# Patient Record
Sex: Female | Born: 1965 | Race: White | Hispanic: No | Marital: Married | Smoking: Never smoker
Health system: Southern US, Community
[De-identification: ages and names within clinical notes are randomized; demographics above are authoritative.]

## PROBLEM LIST (undated history)

## (undated) DIAGNOSIS — Z8669 Personal history of other diseases of the nervous system and sense organs: Secondary | ICD-10-CM

## (undated) DIAGNOSIS — R42 Dizziness and giddiness: Secondary | ICD-10-CM

## (undated) DIAGNOSIS — K649 Unspecified hemorrhoids: Secondary | ICD-10-CM

## (undated) DIAGNOSIS — Z9889 Other specified postprocedural states: Secondary | ICD-10-CM

## (undated) DIAGNOSIS — R32 Unspecified urinary incontinence: Secondary | ICD-10-CM

## (undated) DIAGNOSIS — B009 Herpesviral infection, unspecified: Secondary | ICD-10-CM

## (undated) DIAGNOSIS — Z87440 Personal history of urinary (tract) infections: Secondary | ICD-10-CM

## (undated) DIAGNOSIS — M7500 Adhesive capsulitis of unspecified shoulder: Secondary | ICD-10-CM

## (undated) DIAGNOSIS — Z8759 Personal history of other complications of pregnancy, childbirth and the puerperium: Secondary | ICD-10-CM

## (undated) DIAGNOSIS — D0330 Melanoma in situ of unspecified part of face: Secondary | ICD-10-CM

## (undated) DIAGNOSIS — R253 Fasciculation: Secondary | ICD-10-CM

## (undated) DIAGNOSIS — R112 Nausea with vomiting, unspecified: Secondary | ICD-10-CM

## (undated) DIAGNOSIS — M1990 Unspecified osteoarthritis, unspecified site: Secondary | ICD-10-CM

## (undated) DIAGNOSIS — C801 Malignant (primary) neoplasm, unspecified: Secondary | ICD-10-CM

## (undated) DIAGNOSIS — I471 Supraventricular tachycardia: Secondary | ICD-10-CM

## (undated) HISTORY — DX: Personal history of other complications of pregnancy, childbirth and the puerperium: Z87.59

## (undated) HISTORY — DX: Other specified postprocedural states: Z98.890

## (undated) HISTORY — DX: Unspecified urinary incontinence: R32

## (undated) HISTORY — DX: Fasciculation: R25.3

## (undated) HISTORY — DX: Supraventricular tachycardia: I47.1

## (undated) HISTORY — DX: Unspecified hemorrhoids: K64.9

## (undated) HISTORY — DX: Dizziness and giddiness: R42

## (undated) HISTORY — DX: Personal history of urinary (tract) infections: Z87.440

## (undated) HISTORY — DX: Personal history of other diseases of the nervous system and sense organs: Z86.69

## (undated) HISTORY — DX: Unspecified osteoarthritis, unspecified site: M19.90

## (undated) HISTORY — DX: Adhesive capsulitis of unspecified shoulder: M75.00

## (undated) HISTORY — DX: Malignant (primary) neoplasm, unspecified: C80.1

## (undated) HISTORY — DX: Herpesviral infection, unspecified: B00.9

## (undated) HISTORY — DX: Melanoma in situ of unspecified part of face: D03.30

## (undated) HISTORY — DX: Other specified postprocedural states: R11.2

## (undated) HISTORY — PX: BASAL CELL CARCINOMA EXCISION: SHX1214

---

## 2000-06-30 HISTORY — PX: OTHER SURGICAL HISTORY: SHX169

## 2002-03-23 ENCOUNTER — Other Ambulatory Visit: Admission: RE | Admit: 2002-03-23 | Discharge: 2002-03-23 | Payer: Self-pay | Admitting: Obstetrics and Gynecology

## 2003-04-18 ENCOUNTER — Other Ambulatory Visit: Admission: RE | Admit: 2003-04-18 | Discharge: 2003-04-18 | Payer: Self-pay | Admitting: Obstetrics and Gynecology

## 2004-04-19 ENCOUNTER — Other Ambulatory Visit: Admission: RE | Admit: 2004-04-19 | Discharge: 2004-04-19 | Payer: Self-pay | Admitting: Obstetrics and Gynecology

## 2004-07-30 ENCOUNTER — Ambulatory Visit: Payer: Self-pay | Admitting: Family Medicine

## 2004-08-28 ENCOUNTER — Ambulatory Visit: Payer: Self-pay | Admitting: Family Medicine

## 2005-03-01 ENCOUNTER — Inpatient Hospital Stay (HOSPITAL_COMMUNITY): Admission: AD | Admit: 2005-03-01 | Discharge: 2005-03-05 | Payer: Self-pay | Admitting: Obstetrics and Gynecology

## 2005-04-17 ENCOUNTER — Other Ambulatory Visit: Admission: RE | Admit: 2005-04-17 | Discharge: 2005-04-17 | Payer: Self-pay | Admitting: Obstetrics and Gynecology

## 2005-07-23 ENCOUNTER — Ambulatory Visit: Payer: Self-pay | Admitting: Family Medicine

## 2006-04-15 ENCOUNTER — Ambulatory Visit: Payer: Self-pay | Admitting: Family Medicine

## 2011-07-16 ENCOUNTER — Encounter: Payer: Self-pay | Admitting: Family Medicine

## 2011-07-16 ENCOUNTER — Ambulatory Visit (INDEPENDENT_AMBULATORY_CARE_PROVIDER_SITE_OTHER): Payer: BC Managed Care – PPO | Admitting: Family Medicine

## 2011-07-16 VITALS — BP 118/74 | HR 64 | Temp 98.7°F | Ht 66.5 in | Wt 158.5 lb

## 2011-07-16 DIAGNOSIS — C4330 Malignant melanoma of unspecified part of face: Secondary | ICD-10-CM

## 2011-07-16 DIAGNOSIS — Z23 Encounter for immunization: Secondary | ICD-10-CM

## 2011-07-16 DIAGNOSIS — Z8669 Personal history of other diseases of the nervous system and sense organs: Secondary | ICD-10-CM | POA: Insufficient documentation

## 2011-07-16 DIAGNOSIS — B009 Herpesviral infection, unspecified: Secondary | ICD-10-CM | POA: Insufficient documentation

## 2011-07-16 DIAGNOSIS — R42 Dizziness and giddiness: Secondary | ICD-10-CM

## 2011-07-16 DIAGNOSIS — Z87440 Personal history of urinary (tract) infections: Secondary | ICD-10-CM | POA: Insufficient documentation

## 2011-07-16 DIAGNOSIS — D0330 Melanoma in situ of unspecified part of face: Secondary | ICD-10-CM | POA: Insufficient documentation

## 2011-07-16 DIAGNOSIS — R002 Palpitations: Secondary | ICD-10-CM

## 2011-07-16 MED ORDER — MOMETASONE FUROATE 50 MCG/ACT NA SUSP: 2.0000 | Freq: Every day | NASAL | Status: DC

## 2011-07-16 NOTE — Assessment & Plan Note (Addendum)
Discussed different etiologies of vertigo. Exam not consistent with BPPV. Given occurred with ST, resp sxs, anticipate labrynthitis.   Reasurred, if worsening to let me know for further eval (or meclizine vs consider referral to vestibular rehab.) Given some middle ear congestion on exam, provided with nasonex trial to speed recovery (if attributable to sinus congestion)

## 2011-07-16 NOTE — Assessment & Plan Note (Signed)
Pt prefers to treat episodes as they come. Discussed daily therapy (with 1gm vs 500mg ) or taking valtrex 1 wk around cycle to see if improvement in outbreaks.

## 2011-07-16 NOTE — Progress Notes (Signed)
Subjective:    Patient ID: Brandi Cherry, female    DOB: 01-18-1966, 46 y.o.   MRN: [id number]  HPI CC: new pt, re establish  Prior saw Dr. Milinda Antis.  Has not been seen by our office for several years.  Has been f/u with GYN, has seen cards in interim.  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  Takes valtrex 1000mg  as needed.  Typical of herpes - tingling/paresthesias prior.  Used to get every few years.  This last year worsening s/p several courses of valtrex.  Last episode lasted 4 mo (april to august), now seems to have flares with cycle.  Recent stress this month although pt doesn't necessarily feel stressed.  Also seems to get painful pustules on scalp with periods.  Palpitations - first episodes occurred with son's pregnancy, told pregnancy induced, heart monitor for 1 mo didn't notice any abnormality.  Delivery 6 yrs ago.  After delivery, had another episode.  Started taking lysine and zinc - worsened palpitations.  Returned to see cardiologist and nothing found on heart monitor.  May have had anxiety associated with this.  Last cardiology eval was 10/2010 [facility]).  Has had normal echo, slight murmur found.  H/o vertigo earlier in year.  Happening at work, epidoses of dizziness, nausea where she has to step out of meetings.  Worst episode was end of December (nausea/vomiting, vertigo), happened after bad ST, ear ache.  sxs reproducible with laying down flat.  Not positional or with head movements.  Vertigo described as "room spinning around me" and had fall due to this x1.  Never had abx.  Stays with "floating" feeling.  No allergic rhinitis sxs.  Mild PNDrainage sxs.  H/o miscarriage.  More irritable, wonders if approaching menopause.  OBGYN - Dr. Rana Snare.  Has had blood work done in summer and told all normal.  (thyroid checked)  Caffeine: occasional tea, rare caffeine otherwise Lives with husband, son and daughter, no pets Occupation: Clinical biochemist Edu: [employer] Activity: no  regular exercise Diet: good.  Lots of water, daily fruits/vegetables, red meat 1x/wk, fish 2x/wk  Preventative:  Well woman with OBGYN.  mammo normal. Last blood work was in summer. Flu shot - declines. Tetanus - unsure.  Would like today.  Tdap provided.  Medications and allergies reviewed and updated in chart.  Past histories reviewed and updated if relevant as below. There is no problem list on file for this patient.  Past Medical History  Diagnosis Date  . Palpitation     s/p multiple holters and normal  . Heart murmur   . Hx of migraines     with cycle  . Urine incontinence   . Vertigo   . 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 History of UTI   . Melanoma in situ of face     gets yearly skin checks [name])   Past Surgical History  Procedure Date  . Hospitalization 2002    urosepsis, again with son   History  Substance Use Topics  . Smoking status: Never Smoker   . Smokeless tobacco: Never Used  . Alcohol Use: No   Family History  Problem Relation Age of Onset  . Thyroid disease Mother   . Coronary artery disease Father 76    MI  . Hypertension Father   . Cancer Maternal Uncle     unsure  . Mental retardation Maternal Grandmother  bipolar  . Diabetes Neg Hx    No Known Allergies No current outpatient prescriptions on file prior to visit.   Review of Systems  Constitutional: Negative for fever, chills, activity change, appetite change, fatigue and unexpected weight change.  HENT: Negative for hearing loss and neck pain.   Eyes: Positive for visual disturbance.  Respiratory: Negative for cough, chest tightness, shortness of breath and wheezing.   Cardiovascular: Negative for chest pain, palpitations and leg swelling.  Gastrointestinal: Negative for nausea, vomiting, abdominal pain, diarrhea, constipation, blood in stool and abdominal distention.  Genitourinary: Negative for hematuria and  difficulty urinating.  Musculoskeletal: Negative for myalgias and arthralgias.  Skin: Negative for rash.  Neurological: Positive for dizziness. Negative for seizures, syncope and headaches.  Hematological: Does not bruise/bleed easily.  Psychiatric/Behavioral: Negative for dysphoric mood. The patient is not nervous/anxious.        Objective:   Physical Exam  Nursing note and vitals reviewed. Constitutional: She is oriented to person, place, and time. She appears well-developed and well-nourished. No distress.  HENT:  Head: Normocephalic and atraumatic.  Right Ear: Hearing, tympanic membrane, external ear and ear canal normal.  Left Ear: Hearing, tympanic membrane, external ear and ear canal normal.  Nose: Nose normal. No mucosal edema or rhinorrhea. Right sinus exhibits no maxillary sinus tenderness and no frontal sinus tenderness. Left sinus exhibits no maxillary sinus tenderness and no frontal sinus tenderness.  Mouth/Throat: Uvula is midline, oropharynx is clear and moist and mucous membranes are normal. No oropharyngeal exudate, posterior oropharyngeal edema, posterior oropharyngeal erythema or tonsillar abscesses.       Slight congestion behind TMs L>R No significant PNDrainage  Eyes: Conjunctivae and EOM are normal. Pupils are equal, round, and reactive to light. No scleral icterus.  Neck: Normal range of motion. Neck supple.  Cardiovascular: Normal rate, regular rhythm, normal heart sounds and intact distal pulses.   No murmur heard. Pulses:      Radial pulses are 2+ on the right side, and 2+ on the left side.  Pulmonary/Chest: Effort normal and breath sounds normal. No respiratory distress. She has no wheezes. She has no rales.  Abdominal: Soft. Bowel sounds are normal. She exhibits no distension and no mass. There is no tenderness. There is no rebound and no guarding.  Musculoskeletal: Normal range of motion.  Lymphadenopathy:    She has no cervical adenopathy.  Neurological:  She is alert and oriented to person, place, and time. She has normal strength. No cranial nerve deficit or sensory deficit. She exhibits normal muscle tone. Coordination and gait normal.       CN grossly intact, station and gait intact Normal FTN. Neg dix hallpike.  No nystagmus  Skin: Skin is warm and dry. No rash noted.       Hardened nodule on right forehead, no fluctuance.  Psychiatric: She has a normal mood and affect. Her behavior is normal. Judgment and thought content normal.       Assessment & Plan:

## 2011-07-16 NOTE — Patient Instructions (Signed)
For palpitations - we will monitor for now.  If coming back, let us know. For vertigo - doesn't sound like BPV.  Could be labrynthitis - see below.  If that is the case, should improve with time.  If return of vertigo, please let me know for referral to vestibular rehab. Work on starting multivitamin, incorporating exercise into routine. Good to meet you today, call us with questions. Return as needed or if any worsening of symptoms.  Labyrinthitis (Inner Ear Inflammation) Your exam shows you have an inner ear disturbance or labyrinthitis. The cause of this condition is not known. But it may be due to a virus infection. The symptoms of labyrinthitis include vertigo or dizziness made worse by motion, nausea and vomiting. The onset of labyrinthitis may be very sudden. It usually lasts for a few days and then clears up over 1-2 weeks. The treatment of an inner ear disturbance includes bed rest and medications to reduce dizziness, nausea, and vomiting. You should stay away from alcohol, tranquilizers, caffeine, nicotine, or any medicine your doctor thinks may make your symptoms worse. Further testing may be needed to evaluate your hearing and balance system. Please see your doctor or go to the emergency room right away if you have:  Increasing vertigo, earache, loss of hearing, or ear drainage.   Headache, blurred vision, trouble walking, fainting, or fever.   Persistent vomiting, dehydration, or extreme weakness.  Document Released: 06/16/2005 Document Revised: 02/26/2011 Document Reviewed: 12/02/2006 [facility] Patient Information 2012 [place], [place].

## 2011-07-16 NOTE — Assessment & Plan Note (Signed)
As this doesn't currently seem to be active, advised to monitor sxs for now. Discussed if sxs return to come in and would likely repeat holter.

## 2011-08-25 ENCOUNTER — Ambulatory Visit (INDEPENDENT_AMBULATORY_CARE_PROVIDER_SITE_OTHER): Payer: BC Managed Care – PPO | Admitting: Family Medicine

## 2011-08-25 ENCOUNTER — Encounter: Payer: Self-pay | Admitting: Family Medicine

## 2011-08-25 DIAGNOSIS — J069 Acute upper respiratory infection, unspecified: Secondary | ICD-10-CM

## 2011-08-25 NOTE — Patient Instructions (Signed)
Drink plenty of fluids, take ibuprofen with food as needed, and gargle with warm salt water for your throat.  Try to wean off the afrin.  Get some rest.  This should gradually improve.  Take care.  Let us know if you have other concerns.

## 2011-08-25 NOTE — Progress Notes (Signed)
duration of symptoms: 5 days, worse over the last 3 days.  Rhinorrhea: no Congestion: yes ear pain: yes sore throat: yes Cough: yes, dry Myalgias: yes other concerns: mult sick contacts at school Fever: over the weekend, ~101.  Face feels flushed.  Chills.  Out of work today.   No flu shot this past fall.   Having to use afrin at night. No other help with OTC meds.   ROS: See HPI.  Otherwise negative.    Meds, vitals, and allergies reviewed.   GEN: nad, alert and oriented HEENT: mucous membranes moist, TM w/o erythema, nasal epithelium injected, OP with cobblestoning, sinuses not ttp x4 NECK: supple w/o LA CV: rrr. PULM: ctab, no inc wob ABD: soft, +bs EXT: no edema

## 2011-08-26 DIAGNOSIS — J069 Acute upper respiratory infection, unspecified: Secondary | ICD-10-CM | POA: Insufficient documentation

## 2011-08-26 NOTE — Assessment & Plan Note (Signed)
Likely viral, nontoxic.  ddx d/w pt.  This could be flu, out of window for tamiflu and flu test likely not useful.  Not dehydrated.  Supportive tx and f/u prn.  She agrees. Okay for outpatient f/u.

## 2011-10-17 ENCOUNTER — Encounter: Payer: Self-pay | Admitting: Family Medicine

## 2011-10-17 ENCOUNTER — Ambulatory Visit (INDEPENDENT_AMBULATORY_CARE_PROVIDER_SITE_OTHER): Payer: BC Managed Care – PPO | Admitting: Family Medicine

## 2011-10-17 ENCOUNTER — Telehealth: Payer: Self-pay | Admitting: *Deleted

## 2011-10-17 VITALS — BP 126/76 | HR 88 | Temp 98.1°F | Wt 157.8 lb

## 2011-10-17 DIAGNOSIS — S0992XA Unspecified injury of nose, initial encounter: Secondary | ICD-10-CM | POA: Insufficient documentation

## 2011-10-17 DIAGNOSIS — S0993XA Unspecified injury of face, initial encounter: Secondary | ICD-10-CM

## 2011-10-17 DIAGNOSIS — S199XXA Unspecified injury of neck, initial encounter: Secondary | ICD-10-CM

## 2011-10-17 NOTE — Assessment & Plan Note (Signed)
Nasal injury last night. Midline swelling and pain. Possibly tip of nasal bone fracture but no displacement.   No other red flags. Supportive care discussed, rec continued NSAIDs, ice. If continued right nasal congestion even after inflammation resolved, to update me for referral to ENT. No need for facial CT today. Pt declines narcotics today.  Will continue ibuprofen.

## 2011-10-17 NOTE — Telephone Encounter (Signed)
Patient called and thinks she broke her nose after running into a door. She has been scheduled for this afternoon for eval. She says she feels fine and is at work. I checked with Terri and we do not do facial/skull xrays here. Do you want me to send her to [facility] or is it ok to have her come in here?

## 2011-10-17 NOTE — Progress Notes (Signed)
  Subjective:    Patient ID: Brandi Cherry, female    DOB: 27-Sep-1965, 46 y.o.   MRN: [id number]  HPI CC: nasal injury  DOI:10/16/2011 last night 10:45pm  Hit wooden part of door last night - direct blow to head.  Was more sleepy.  No EtOH.  No nose bleeding.  No clear drainage from nose.  Feels right side more closed up now.  Very painful currently, throbbing.  Swollen.  Has been taking ibuprofen to control pain.  Used ice last night.  Did go to work today.  Did have injury at age 65yo, hit nose into wall.  No surgery done.  Exact same spot.  No nasal or septal surgeries in past.  + HA.  No double vision.  No tooth pain.  No neck pain.  Review of Systems Per HPI    Objective:   Physical Exam  Nursing note and vitals reviewed. Constitutional: She appears well-developed and well-nourished. No distress.  HENT:  Head: Normocephalic.    Right Ear: Hearing, tympanic membrane, external ear and ear canal normal.  Left Ear: Hearing, tympanic membrane, external ear and ear canal normal.  Nose: Nasal deformity present. No septal deviation or nasal septal hematoma. Right sinus exhibits no maxillary sinus tenderness and no frontal sinus tenderness. Left sinus exhibits no maxillary sinus tenderness and no frontal sinus tenderness.  Mouth/Throat: Oropharynx is clear and moist. No oropharyngeal exudate.       Swelling bridge of nose with ecchymosis present as well as right sided swelling. Normal nasal turbinates visualized. No septal hematoma visualized. No nasal bleeding or clear discharge visualized. Maximal tenderness at midline nasal bridge.  No maxillary pain.  No TMJ pain. TMs clear  Neck: Normal range of motion. Neck supple.  Skin: Skin is warm and dry. No rash noted.  Psychiatric: She has a normal mood and affect.       Assessment & Plan:

## 2011-10-17 NOTE — Patient Instructions (Signed)
Could be broken nose.  Give more time for now. Continue ibuprofen and ice to nose (no more than 15 min at a time) If still trouble breathing out of right side after swelling subsides or any worsening, please let me know.  Nasal Fracture A nasal fracture is a break or crack in the bones of the nose. A minor break usually heals in a month. You often will receive black eyes from a nasal fracture. This is not a cause for concern. The black eyes will go away over 1 to 2 weeks.  DIAGNOSIS  Your caregiver may want to examine you if you are concerned about a fracture of the nose. X-rays of the nose may not show a nasal fracture even when one is present. Sometimes your caregiver must wait 1 to 5 days after the injury to re-check the nose for alignment and to take additional X-rays. Sometimes the caregiver must wait until the swelling has gone down. TREATMENT Minor fractures that have caused no deformity often do not require treatment. More serious fractures where bones are displaced may require surgery. This will take place after the swelling is gone. Surgery will stabilize and align the fracture. HOME CARE INSTRUCTIONS   Put ice on the injured area.   Put ice in a plastic bag.   Place a towel between your skin and the bag.   Leave the ice on for 15 to 20 minutes, 3 to 4 times a day.   Take medications as directed by your caregiver.   Only take over-the-counter or prescription medicines for pain, discomfort, or fever as directed by your caregiver.   If your nose starts bleeding, squeeze the soft parts of the nose against the center wall while you are sitting in an upright position for 10 minutes.   Contact sports should be avoided for at least 3 to 4 weeks or as directed by your caregiver.  SEEK MEDICAL CARE IF:  Your pain increases or becomes severe.   You continue to have nosebleeds.   The shape of your nose does not return to normal within 5 days.   You have pus draining from the nose.    SEEK IMMEDIATE MEDICAL CARE IF:   You have bleeding from your nose that does not stop after 20 minutes of pinching the nostrils closed and keeping ice on the nose.   You have clear fluid draining from your nose.   You notice a grape-like swelling on the dividing wall between the nostrils (septum). This is a collection of blood (hematoma) that must be drained to help prevent infection.   You have difficulty moving your eyes.   You have recurrent vomiting.  Document Released: 06/13/2000 Document Revised: 06/05/2011 Document Reviewed: 09/30/2010 [facility] Patient Information 2012 [place], [place].

## 2011-10-17 NOTE — Telephone Encounter (Signed)
No i can see her.  Thanks.

## 2011-11-10 ENCOUNTER — Telehealth: Payer: Self-pay | Admitting: Family Medicine

## 2011-11-10 NOTE — Telephone Encounter (Signed)
Caller: Toiya/Mother; PCP: Eustaquio Boyden; CB#: [phone]; ; ; Call regarding Back Pain;  Pt is calling about having back pain going down her right leg to her toes. Onset 1 week ago. It worsened on Friday 11/07/11. Rn triaged per back sx and scheduled pt tomorrow at 11:15 with Dr. Buena Irish.

## 2011-11-10 NOTE — Telephone Encounter (Signed)
Noted. Thanks.

## 2011-11-11 ENCOUNTER — Ambulatory Visit (INDEPENDENT_AMBULATORY_CARE_PROVIDER_SITE_OTHER): Payer: BC Managed Care – PPO | Admitting: Family Medicine

## 2011-11-11 ENCOUNTER — Encounter: Payer: Self-pay | Admitting: Family Medicine

## 2011-11-11 VITALS — BP 102/80 | HR 87 | Temp 98.1°F | Wt 157.0 lb

## 2011-11-11 DIAGNOSIS — M545 Low back pain, unspecified: Secondary | ICD-10-CM | POA: Insufficient documentation

## 2011-11-11 MED ORDER — NAPROXEN 500 MG PO TABS: ORAL_TABLET | ORAL | Status: AC

## 2011-11-11 MED ORDER — CYCLOBENZAPRINE HCL 10 MG PO TABS: 10.0000 mg | ORAL_TABLET | Freq: Two times a day (BID) | ORAL | Status: AC | PRN

## 2011-11-11 NOTE — Assessment & Plan Note (Signed)
Anticipate lumbar strain vs HNP. No red flags, no neurological deficits. Treat conservatively with NSAIDs, ice/heat, and muscle relaxant. Update [place] if not improving as expected.

## 2011-11-11 NOTE — Progress Notes (Signed)
  Subjective:    Patient ID: Brandi Cherry, female    DOB: [birth year], 46 y.o.   MRN: [id number]  HPI CC: leg pain  1+ wk h/o "pinched nerve feeling" in right lower back.  Progressively worsening over later part of last week.  Having shooting pain down leg.  Left posterior hip pain.  Now constant pain.  No inciting injury or trauma she noted.  Better when laying completely flat.  Watched movie over weekend laying down on couch.  At end of movie, had trouble getting up from back pain.    New thing at home - puppy in pen she has to lean over to lift him.  Has been using bengay, advil.  H/o lower back flares in past as well as sciatic irritation when pregnant.  No fevers/chills, numbness, weakness in leg, bowel/bladder incontinence.  Wt Readings from Last 3 Encounters:  11/11/11 157 lb (71.215 kg)  10/17/11 157 lb 12 oz (71.555 kg)  08/25/11 160 lb (72.576 kg)     Review of Systems Per HPI    Objective:   Physical Exam  Nursing note and vitals reviewed. Constitutional: She appears well-developed and well-nourished. No distress.       Uncomfortable with position changes  Musculoskeletal: She exhibits no edema.       + midline lumbar spine tenderness as well as paraspinal mm tenderness/spasm. Neg SLR bilaterally. No pain with int/ext rotation of hip. No pain with FABER at SIJ + pain at SIJ bilaterally R>L. No pain at sciatic notch or GTB.  Neurological: She is alert. She has normal strength and normal reflexes. No sensory deficit.  Reflex Scores:      Patellar reflexes are 2+ on the right side and 2+ on the left side.      Achilles reflexes are 2+ on the right side and 2+ on the left side.      Able to heel/toe walk.       Assessment & Plan:

## 2011-11-11 NOTE — Patient Instructions (Signed)
I think you have lumbar strain, possible herniated disk. Treat with prescription anti inflammatory as well as muscle relaxant as needed (use muscle relaxant when you're at home or at night as it can make you sleepy) Stretching exercises for lower back provided. Update [place] if not improving as expected or any worsening.

## 2012-06-30 DIAGNOSIS — M7500 Adhesive capsulitis of unspecified shoulder: Secondary | ICD-10-CM

## 2012-06-30 HISTORY — DX: Adhesive capsulitis of unspecified shoulder: M75.00

## 2012-07-21 DIAGNOSIS — I4719 Other supraventricular tachycardia: Secondary | ICD-10-CM | POA: Insufficient documentation

## 2012-07-21 DIAGNOSIS — I471 Supraventricular tachycardia: Secondary | ICD-10-CM | POA: Insufficient documentation

## 2013-01-25 ENCOUNTER — Ambulatory Visit (INDEPENDENT_AMBULATORY_CARE_PROVIDER_SITE_OTHER): Payer: BC Managed Care – PPO | Admitting: Family Medicine

## 2013-01-25 ENCOUNTER — Encounter: Payer: Self-pay | Admitting: Family Medicine

## 2013-01-25 VITALS — BP 110/68 | HR 100 | Temp 98.2°F | Wt 162.0 lb

## 2013-01-25 DIAGNOSIS — M79609 Pain in unspecified limb: Secondary | ICD-10-CM

## 2013-01-25 DIAGNOSIS — M79601 Pain in right arm: Secondary | ICD-10-CM | POA: Insufficient documentation

## 2013-01-25 MED ORDER — NAPROXEN 500 MG PO TABS: ORAL_TABLET | ORAL | Status: DC

## 2013-01-25 NOTE — Assessment & Plan Note (Signed)
Not consistent with one etiology - anticipate combination of biceps tendinitis and RTC injury. Treat as such with naprosyn course, ice, rest, and stretching exercises provided from [facility] pt advisor on RTC injury and biceps tendinopathy If not better after 2 weeks, or any worsening, recommended she call to schedule appointment with Dr. Patsy Lager for further evaluation. pt agrees with plan.

## 2013-01-25 NOTE — Patient Instructions (Signed)
I think you have combination of biceps tendinopathy and rotator cuff inflammation. Do stretching exercises provided along with naprosyn twice daily with food.  Continue ice of shoulder and rest. If not improving with this, call us to schedule follow up appointment with Dr. Patsy Lager. Good to see you today, call us with questions.

## 2013-01-25 NOTE — Progress Notes (Signed)
  Subjective:    Patient ID: Brandi Cherry, female    DOB: July 25, 1965, 47 y.o.   MRN: [id number]  HPI CC: R shoulder pain  Almost 1 mo h/o R shoulder pain.  Worse with laying on arm.  Pain with supination of R forearm, worse with raising arm overhead behind her.  + pinching superior R shoulder.  Occasional radiation to thumb.  Shooting pain upper arm.  So far has tried ice, ibuprofen 400mg  TID (didn't really help). Denies inciting trauma/injury.  No numbness or weakness of R arm. No neck pain.  13 hour drive to [place] recently.  Past Medical History  Diagnosis Date  . Palpitation     s/p multiple holters - finally found possible SVT - improved after started beta blocker (Dr. Tresa Endo)  . Heart murmur   . Hx of migraines     with cycle  . Urine incontinence   . Vertigo   . 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 History of UTI     tends to get with relations  . Melanoma in situ of face     gets yearly skin checks [name])  . History of miscarriage     G5P2     Review of Systems Per HPI    Objective:   Physical Exam  Nursing note and vitals reviewed. Constitutional: She is oriented to person, place, and time. She appears well-developed and well-nourished. No distress.  Musculoskeletal: She exhibits no edema.  FROM at neck. Limited ROM of R shoulder past 90 deg in abduction and forward flexion 2/2 pain. Tender to palpation at anterior shoulder, biceps tendon and lateral belly of triceps muscle. No tenderness with testing of SITS against resistance except mildly positive empty can sign on right. Mild pain with crossover and impingement test on right. No pain with rotation of humeral head in [facility] joint. + speed and yerguson sign.  Neurological: She is alert and oriented to person, place, and time. No sensory deficit.  Skin: Skin is warm and dry. No rash noted.       Assessment & Plan:

## 2013-01-31 ENCOUNTER — Telehealth: Payer: Self-pay

## 2013-01-31 NOTE — Telephone Encounter (Signed)
Pt left v/m; pt saw Dr Reece Agar on 01/25/13 and shoulder is no better; Dr Patsy Lager will be out of office until 02/16/13; pt request referral.Please advise.

## 2013-01-31 NOTE — Telephone Encounter (Signed)
plz offer referral to Dr. Katrinka Blazing our Sports Med doc in [place] if she doesn't mind driving there, or we will refer to local ortho

## 2013-01-31 NOTE — Telephone Encounter (Signed)
Message left notifying patient. Advised to call me back and let me know what she would like to do. Will await return call.

## 2013-02-03 ENCOUNTER — Encounter: Payer: Self-pay | Admitting: Family Medicine

## 2013-02-03 ENCOUNTER — Ambulatory Visit (INDEPENDENT_AMBULATORY_CARE_PROVIDER_SITE_OTHER): Payer: BC Managed Care – PPO | Admitting: Family Medicine

## 2013-02-03 VITALS — BP 120/78 | HR 82 | Ht 66.0 in | Wt 163.0 lb

## 2013-02-03 DIAGNOSIS — M7501 Adhesive capsulitis of right shoulder: Secondary | ICD-10-CM

## 2013-02-03 DIAGNOSIS — M7500 Adhesive capsulitis of unspecified shoulder: Secondary | ICD-10-CM | POA: Insufficient documentation

## 2013-02-03 MED ORDER — TRIAMCINOLONE ACETONIDE 40 MG/ML IJ SUSP: 40.0000 mg | Freq: Once | INTRAMUSCULAR | Status: DC

## 2013-02-03 NOTE — Assessment & Plan Note (Addendum)
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  adhesive capsulitis  After informed written and verbal consent, patient was seated on exam table. Right shoulder was prepped with alcohol swab and utilizing posterior approach, patient's right glenohumeral space was injected with 2:2:1  Lidocaine 1% :marcaine 0.5%: Kenalog 40mg /dL. Patient tolerated the procedure well without immediate complications.  Patient was given a systematic ROM protocol from [facility] to be done daily. Emphasized that without adherence to HEP, likely will not get better.  Tylenol or NSAID of choice prn for pain relief  Patient will be sent for formal PT for aggressive frozen shoulder ROM. Will return in 4-8 weeks for further evaluation and likely repeat corticosteroid injection.

## 2013-02-03 NOTE — Patient Instructions (Signed)
You have a frozen shoulder (adhesive capsulitis), a buildup of scar tissue that limits motion of the shoulder joint. We do not know what causes it and it can take quite some time to heal which can be frustrating. I am here to help.  Limit lifting and overhead activities as much as possible. Heat 20 minutes at a time 3-4 times a day may help with movement and stiffness. Tylenol and/or aleve as needed for pain and inflammation. Steroid injections in a series have been shown to help with pain and motion and I did this today.   Usually I do one at first appointment and will see you again in 4-8 weeks.  Codman exercises (pendulum, wall walking or table slides, arm circles) - do 3 sets of 15 daily of each of these. Physical therapy for rotator cuff strengthening is helpful. Follow up in 4-8 weeks

## 2013-02-03 NOTE — Progress Notes (Signed)
I'm seeing this patient by the request  of:  Dr. Sharen Cherry  CC and history of present illness: Patient is a very pleasant left-hand dominant 47 year old female is coming in with right shoulder pain. Patient states that approximately last month she's been having pain of the right shoulder. Patient somewhat remembers in acute onset of pain when she was in [place] and she went to pull herself up from a boat. Patient states that she had somewhat of a sharp pain at that time but was still able to do all her range of motion. Over the course of time it seems to have been worsening. Patient states that the pain is bad but in addition to that she is losing her range of motion. Patient states that it is very difficult to reach behind her back. Patient states that the pain is mostly on the right shoulder and she states posterior aspect hurts more. Patient describes as a dull ache with sometimes sharp searing pain that does not radiate. Patient denies any numbness or tingling but states that the arm feels heavy. Patient has tried some naproxen with minimal benefit. Patient states icing does help a little bit patient states that the pain is waking her up at night and affecting all activities of daily living including reaching above her head.   Past medical, surgical, family and social history reviewed. Medications reviewed all in the electronic medical record.   Review of Systems: No headache, visual changes, nausea, vomiting, diarrhea, constipation, dizziness, abdominal pain, skin rash, fevers, chills, night sweats, weight loss, swollen lymph nodes, body aches, joint swelling, muscle aches, chest pain, shortness of breath, mood changes.   Objective:    Blood pressure 120/78, pulse 82, height 5\' 6"  (1.676 m), weight 163 lb (73.936 kg), last menstrual period 01/06/2013, SpO2 98.00%.   General: No apparent distress alert and oriented x3 mood and affect normal HEENT: Pupils equal, extraocular movements  intact Respiratory: Patient's speak in full sentences and does not appear short of breath Cardiovascular: No lower extremity edema Skin: Warm dry intact with no signs of infection or rash Abdomen: Soft nontender Neuro: Cranial nerves II through XII are intact, neurovascularly intact in all extremities with 2+ DTRs and 2+ pulses. Shoulder: Right Inspection reveals no abnormalities, atrophy or asymmetry. Palpation is normal with no tenderness over AC joint or bicipital groove. ROM is decreased to forward flexion of 150, abduction to 80, external rotation to 25, and internal rotation to hip. Rotator cuff strength normal throughout. Patient has signs of primary impingement with Neer's and Hawkins as well as a positive speed's No labral pathology noted with negative Obrien's, negative clunk and good stability. Normal scapular function observed. No painful arc and no drop arm sign. Mild apprehension sign Patient's contralateral shoulder which is her dominant hand shows full range of motion with 180 in forward flexion, 120 in abduction, 40 of external rotation and internal rotation to T7  MSK US performed of: Right shoulder This study was ordered, performed, and interpreted by Brandi Cherry D.O.  Shoulder:  Right Supraspinatus:  Appears  on long and transverse views, with bursal bulge seen with shoulder abduction on impingement view. Patient also has significant thickening of the capsule surrounding the area causing difficult to see insertion of the tendon. Infraspinatus:  Appears normal on long and transverse views. Subscapularis:  Appears normal on long and transverse views. Teres Minor:  Appears normal on long and transverse views. AC joint:  Capsule mild distention, no geyser sign. Glenohumeral Joint:  Appears  normal without effusion. Glenoid Labrum:  Intact without visualized tears. Biceps Tendon:  Appears normal on long and transverse views, no fraying of tendon, tendon located in  intertubercular groove, no subluxation with shoulder internal or external rotation. Significant increase in Doppler signal generalized especially of the capsule. This is consistent with frozen shoulder. 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  adhesive capsulitis  After informed written and verbal consent, patient was seated on exam table. Right shoulder was prepped with alcohol swab and utilizing posterior approach, patient's right glenohumeral space was injected with 2:2:1  Lidocaine 1% :marcaine 0.5%: Kenalog 40mg /dL. Patient tolerated the procedure well without immediate complications.  Patient was given a systematic ROM protocol from [facility] to be done daily. Emphasized that without adherence to HEP, likely will not get better.  Tylenol or NSAID of choice prn for pain relief  Patient will be sent for formal PT for aggressive frozen shoulder ROM. Will return in 4-8 weeks for further evaluation and likely repeat corticosteroid injection.        This case required medical decision making of moderate complexity.

## 2013-02-04 NOTE — Telephone Encounter (Signed)
Message left for patient to return my call.  

## 2013-02-04 NOTE — Telephone Encounter (Signed)
Pt seen yesterday by Dr. Katrinka Blazing.Marland Kitchen

## 2013-02-07 ENCOUNTER — Telehealth: Payer: Self-pay | Admitting: *Deleted

## 2013-02-07 MED ORDER — MELOXICAM 15 MG PO TABS: 15.0000 mg | ORAL_TABLET | Freq: Every day | ORAL | Status: DC

## 2013-02-07 NOTE — Telephone Encounter (Signed)
Pt called states she had a cortisone injection in the right shoulder, states she has had no relief.  Please advise

## 2013-02-07 NOTE — Telephone Encounter (Signed)
Called the patient back, overall it does so which is getting better, told patient to take 24 hours off from exercises. Patient and will start after that. Told her that the steroid may still kick in soon in patient's new pain might be secondary to lysing adhesions. Patient will come back in 3 weeks and we will do an ultrasound-guided injection of the glenohumeral joint.

## 2013-02-23 ENCOUNTER — Telehealth: Payer: Self-pay

## 2013-02-23 DIAGNOSIS — M7501 Adhesive capsulitis of right shoulder: Secondary | ICD-10-CM

## 2013-02-23 NOTE — Telephone Encounter (Signed)
Patient called [facility] requesting continued shoulder pain and would like to know what MD advises. She states that she was given two Rx at last appointment that does not resolve pain. Pt would also like to check the status of physical therapy. Thanks

## 2013-02-23 NOTE — Telephone Encounter (Signed)
Called patient back and discussed this is unfortantely not abnormal for frozen shoulder.  Discussed importance of PT and will attempt to get this started ASAP Will also like to see patient in near future for further evaluation and possible repeat injection.

## 2013-02-23 NOTE — Telephone Encounter (Signed)
Please advise.   Brandi Cherry is following up on getting pt scheduled for PT.

## 2013-03-11 ENCOUNTER — Ambulatory Visit (INDEPENDENT_AMBULATORY_CARE_PROVIDER_SITE_OTHER): Payer: BC Managed Care – PPO | Admitting: Family Medicine

## 2013-03-11 ENCOUNTER — Encounter: Payer: Self-pay | Admitting: Family Medicine

## 2013-03-11 VITALS — BP 122/80 | HR 85 | Wt 161.0 lb

## 2013-03-11 DIAGNOSIS — M7500 Adhesive capsulitis of unspecified shoulder: Secondary | ICD-10-CM

## 2013-03-11 DIAGNOSIS — M7501 Adhesive capsulitis of right shoulder: Secondary | ICD-10-CM

## 2013-03-11 MED ORDER — TRAMADOL HCL 50 MG PO TABS: 50.0000 mg | ORAL_TABLET | Freq: Three times a day (TID) | ORAL | Status: DC | PRN

## 2013-03-11 NOTE — Progress Notes (Signed)
Subjective:    CC: Right shoulder pain  HPI: Patient is a 47 year old female who is diagnosed previously with adhesive capsulitis of the right shoulder. Patient states that the pain has been continuous and may be somewhat worse. Patient has decreased the amount of range of motion from previous she thinks. Patient is finding it very difficult to sleep and states that it is waking her up at night. Patient has been doing physical therapy and doing a meloxicam. She does feel that it is better when she is in physical therapy but within an hour afterward she feels the pain again. Patient denies any numbness but states her arm does feel weak likely because she is not moving it. Patient states ice can help as well. Pretty much now though all movements seem to hurt.  Past medical history, Surgical history, Family history not pertinant except as noted below, Social history, Allergies, and medications have been entered into the medical record, reviewed, and no changes needed.   Review of Systems: No fevers, chills, night sweats, weight loss, chest pain, or shortness of breath. Patient also denies any abdominal pain, nausea, vomiting, dysuria or polydipsia.  Objective:   Blood pressure 122/80, pulse 85, weight 161 lb (73.029 kg), SpO2 97.00%.  General: Well Developed, well nourished, and in no acute distress.  Neuro: Alert and oriented x3, extra-ocular muscles intact, sensation grossly intact.  HEENT: Normocephalic, atraumatic, pupils equal round reactive to light, neck supple, no masses, no lymphadenopathy, thyroid nonpalpable.  Skin: Warm and dry, no rashes. Cardiac:  no lower extremity edema. Respiratory: Not using accessory muscles, speaking in full sentences. Abdominal: NT, soft Gait: Nonantlagic, good balance and coordination Lymphatic: no lymphadenopathy in neck or axillae on palpation, non tender.  Musculoskeletal:Inspection and palpation of the right and left lower extremities including the  hips knees and ankles are unremarkable and nontender with full range of motion and good muscle strength and tone and are symmetric. Shoulder: Inspection reveals no abnormalities, atrophy or asymmetry. Palpation has significant diffuse tenderness of the right shoulder range of motion is decreased to patient having external rotation to 5, internal rotation to hip, passive flexion to 85 and abduction to 75. Patient has full range of motion of the contralateral side. Rotator cuff strength normal throughout. Unable to do impingement tests secondary to lack of motion Speeds and Yergason's tests is positive  Musculoskeletal ultrasound shows the patient has significant thickening of the capsule anteriorly and posteriorly. Rotator cuff still appears normal but still has significant hypoechoic changes and patient does have a joint effusion of the glenohumeral joint.  Procedure: Real-time Ultrasound Guided Injection of right glenohumeral joint Device: GE Logiq E  Ultrasound guided injection is preferred based studies that show increased duration, increased effect, greater accuracy, decreased procedural pain, increased response rate with ultrasound guided versus blind injection.  Verbal informed consent obtained.  Time-out conducted.  Noted no overlying erythema, induration, or other signs of local infection.  Skin prepped in a sterile fashion.  Local anesthesia: Topical Ethyl chloride.  With sterile technique and under real time ultrasound guidance:  Joint visualized.  22g 1  inch needle inserted posterior approach. Pictures taken for needle placement. Patient did have injection of 3 cc of 1% lidocaine, 3 cc of 0.5% Marcaine, and 1.0 cc of Kenalog 40 mg/dL. Completed without difficulty  Pain immediately improved suggesting accurate placement of the medication.  Advised to call if fevers/chills, erythema, induration, drainage, or persistent bleeding.  Images permanently stored and available for review  in  the ultrasound unit.  Impression: Technically successful ultrasound guided injection.     Impression and Recommendations:

## 2013-03-11 NOTE — Assessment & Plan Note (Signed)
  adhesive capsulitis  Discuss continuing range of motion exercises at home as well as physical therapy. Injection today under ultrasound guidance. Continue meloxicam Tramadol given to help at night. Patient will return in 3-4 weeks.

## 2013-03-11 NOTE — Patient Instructions (Signed)
Try the tramadol  Please keep the exercises and the PT this is the mainstay.  Lets have you back in 3-4 weeks.

## 2013-03-16 ENCOUNTER — Telehealth: Payer: Self-pay | Admitting: *Deleted

## 2013-03-16 NOTE — Telephone Encounter (Signed)
Called patient back on cell number. No answer left message. Stated that she can either try another rinjection, MRI or nuerontin at night. Told her to call back and we will try one or the other when she makes a decision.   Can also consider surgery but would wait at least until 10-12 months from onset of symptoms.

## 2013-03-16 NOTE — Telephone Encounter (Signed)
Pt called states her shoulder pain is worsening since last Cortisone injection.  Please advise

## 2013-03-17 ENCOUNTER — Other Ambulatory Visit: Payer: Self-pay | Admitting: *Deleted

## 2013-03-17 ENCOUNTER — Ambulatory Visit (INDEPENDENT_AMBULATORY_CARE_PROVIDER_SITE_OTHER): Payer: BC Managed Care – PPO | Admitting: Cardiology

## 2013-03-17 ENCOUNTER — Encounter: Payer: Self-pay | Admitting: Cardiology

## 2013-03-17 ENCOUNTER — Telehealth: Payer: Self-pay | Admitting: *Deleted

## 2013-03-17 VITALS — BP 120/66 | HR 76 | Ht 65.0 in | Wt 166.4 lb

## 2013-03-17 DIAGNOSIS — R002 Palpitations: Secondary | ICD-10-CM

## 2013-03-17 DIAGNOSIS — I471 Supraventricular tachycardia: Secondary | ICD-10-CM

## 2013-03-17 DIAGNOSIS — I4719 Other supraventricular tachycardia: Secondary | ICD-10-CM | POA: Insufficient documentation

## 2013-03-17 DIAGNOSIS — M7501 Adhesive capsulitis of right shoulder: Secondary | ICD-10-CM

## 2013-03-17 DIAGNOSIS — M7500 Adhesive capsulitis of unspecified shoulder: Secondary | ICD-10-CM

## 2013-03-17 MED ORDER — METOPROLOL TARTRATE 25 MG PO TABS: 25.0000 mg | ORAL_TABLET | Freq: Two times a day (BID) | ORAL | Status: DC

## 2013-03-17 NOTE — Telephone Encounter (Signed)
Patient calling c/o rapid heart beat two episodes in the past two weeks and can hear her heart beat in her ears.  Wants to know if she should increase her beta blocker.  Told patient to come in and see Corine Shelter, PA for evaluation.  Agreed to come in now.

## 2013-03-17 NOTE — Assessment & Plan Note (Signed)
Recurrent symptoms for the past couple of months

## 2013-03-17 NOTE — Assessment & Plan Note (Signed)
What she describes sounds like PVCs

## 2013-03-17 NOTE — Progress Notes (Signed)
03/17/2013 Brandi Cherry   August 04, 1965  [id number]  Primary Physicia Eustaquio Boyden, MD Primary Cardiologist: Dr [name]  HPI:  Patient is a 47 year old Caucasian female with a history of palpitations as far back as 7 years ago when she was pregnant. She was seen by Dr. [name] at that time and has worn a heart monitor on 2 separate occasions with no evidence for cause of her palpitations. She had a 2D echocardiogram June 2012 which showed a normal ejection fraction, normal diastolic parameters; there was very mild MR and TR. The patient currently works as a Veterinary surgeon at [employer]. A 30-day heart monitor Oct - Nov 2013 showed episodes of atrial tachycardia and at least 1 episode of SVT, maybe about an 8-beat run of it that was on May 12, 2012. It's not clear if her symptoms were related to her short bursts of atrial tachycardia. She was reassured and placed on low dose Metoprolol and di well until a couple a months ago. She developed a "frozen shoulder" this summer and has had lots of problems with this. She has had injections that have not been effective. She says she doesn't sleep well, "3 hours a night". She has noted increasing palpitations. What she described to me sounded like PVCs with a hard beat and her heart "flipping over". These are associated with pressure in her throat and weakness. This on;ly lasts a few seconds. There is no  History of sustained tachycardia or syncope. She denies any caffeine or stimulant use. She does say she comes from "a long line of nervous people".  She wanted to know if it would be OK to increase her Lopressor.   Current Outpatient Prescriptions  Medication Sig Dispense Refill  . meloxicam (MOBIC) 15 MG tablet Take 1 tablet (15 mg total) by mouth daily. FOR 10 DAYS THEN AS NEEDED  30 tablet  1  . metoprolol tartrate (LOPRESSOR) 25 MG tablet Take 1 tablet (25 mg total) by mouth 2 (two) times daily.  60 tablet  6  . Multiple Vitamin (MULTIVITAMIN)  tablet Take 1 tablet by mouth daily.      . traMADol (ULTRAM) 50 MG tablet Take 1 tablet (50 mg total) by mouth every 8 (eight) hours as needed for pain.  30 tablet  0  . valACYclovir (VALTREX) 1000 MG tablet Take 500 mg by mouth daily.        Current Facility-Administered Medications  Medication Dose Route Frequency Provider Last Rate Last Dose  . triamcinolone acetonide (KENALOG-40) injection 40 mg  40 mg Intra-articular Once Judi Saa, DO        No Known Allergies  History   Social History  . Marital Status: Married    Spouse Name: N/A    Number of Children: N/A  . Years of Education: N/A   Occupational History  . Not on file.   Social History Main Topics  . Smoking status: Never Smoker   . Smokeless tobacco: Never Used  . Alcohol Use: No  . Drug Use: No  . Sexual Activity: Not on file   Other Topics Concern  . Not on file   Social History Narrative   Caffeine: occasional tea   Lives with husband, son and daughter   Occupation; Clinical biochemist   Edu: Masters   Activity: none currently   Diet: good water, daily fruits/vegetables, red meat 1x/wk, fish 2x/wk     Review of Systems: General: negative for chills, fever, night sweats or weight changes.  Cardiovascular: negative for chest pain, dyspnea on exertion, edema, orthopnea, palpitations, paroxysmal nocturnal dyspnea or shortness of breath Dermatological: negative for rash Respiratory: negative for cough or wheezing Urologic: negative for hematuria Abdominal: negative for nausea, vomiting, diarrhea, bright red blood per rectum, melena, or hematemesis Neurologic: negative for visual changes, syncope, or dizziness All other systems reviewed and are otherwise negative except as noted above.    Blood pressure 120/66, pulse 76, height 5\' 5"  (1.651 m), weight 166 lb 6.4 oz (75.479 kg).  General appearance: alert, cooperative and no distress Neck: no carotid bruit and no JVD Lungs: clear to auscultation  bilaterally Heart: regular rate and rhythm  EKG NSR  ASSESSMENT AND PLAN:   Palpitation Recurrent symptoms for the past couple of months  PAT - short runs documented on her last monitor but they did not corrolate with symptoms What she describes sounds like PVCs  Frozen shoulder This has been a problem for the past few months.   PLAN  I had a long discussion with [name] about PVCs and the treatment options fror palpitations. She is very reasonable and seems to have a good understanding that having PVCs with Nl LVF does not mean there is something seriously wrong with her heart. I suggested she increase her Metoprlol to 25 mg BID while she is going through this tough period with her shoulder and the stress this is causing her. I also told her she could take an extra 12.5 mg once a day if needed. I made her a follow up in a month but she will cancel and see Dr [name] in 6 months if she is feeling better.  Ebony Yorio KPA-C 03/17/2013 11:56 AM

## 2013-03-17 NOTE — Assessment & Plan Note (Signed)
This has been a problem for the past few months.

## 2013-04-11 ENCOUNTER — Encounter (HOSPITAL_COMMUNITY): Payer: Self-pay | Admitting: Emergency Medicine

## 2013-04-11 ENCOUNTER — Emergency Department (HOSPITAL_COMMUNITY)
Admission: EM | Admit: 2013-04-11 | Discharge: 2013-04-11 | Disposition: A | Discharge status: Home / Self Care | Payer: BC Managed Care – PPO | Attending: Emergency Medicine | Admitting: Emergency Medicine

## 2013-04-11 DIAGNOSIS — Z79899 Other long term (current) drug therapy: Secondary | ICD-10-CM | POA: Insufficient documentation

## 2013-04-11 DIAGNOSIS — I498 Other specified cardiac arrhythmias: Secondary | ICD-10-CM | POA: Insufficient documentation

## 2013-04-11 DIAGNOSIS — I471 Supraventricular tachycardia: Secondary | ICD-10-CM

## 2013-04-11 DIAGNOSIS — Z8739 Personal history of other diseases of the musculoskeletal system and connective tissue: Secondary | ICD-10-CM | POA: Insufficient documentation

## 2013-04-11 DIAGNOSIS — Z87440 Personal history of urinary (tract) infections: Secondary | ICD-10-CM | POA: Insufficient documentation

## 2013-04-11 DIAGNOSIS — Z8679 Personal history of other diseases of the circulatory system: Secondary | ICD-10-CM | POA: Insufficient documentation

## 2013-04-11 DIAGNOSIS — Z8619 Personal history of other infectious and parasitic diseases: Secondary | ICD-10-CM | POA: Insufficient documentation

## 2013-04-11 DIAGNOSIS — Z85828 Personal history of other malignant neoplasm of skin: Secondary | ICD-10-CM | POA: Insufficient documentation

## 2013-04-11 LAB — CBC WITH DIFFERENTIAL/PLATELET
Basophils Absolute: 0 10*3/uL (ref 0.0–0.1)
Basophils Relative: 0 % (ref 0–1)
Eosinophils Absolute: 0 10*3/uL (ref 0.0–0.7)
Eosinophils Relative: 1 % (ref 0–5)
HCT: 36.9 % (ref 36.0–46.0)
Hemoglobin: 12 g/dL (ref 12.0–15.0)
Lymphocytes Relative: 14 % (ref 12–46)
Lymphs Abs: 0.9 10*3/uL (ref 0.7–4.0)
MCH: 25.6 pg — ABNORMAL LOW (ref 26.0–34.0)
MCHC: 32.5 g/dL (ref 30.0–36.0)
MCV: 78.8 fL (ref 78.0–100.0)
Monocytes Absolute: 0.6 10*3/uL (ref 0.1–1.0)
Monocytes Relative: 8 % (ref 3–12)
Neutro Abs: 5.3 10*3/uL (ref 1.7–7.7)
Neutrophils Relative %: 77 % (ref 43–77)
Platelets: 205 10*3/uL (ref 150–400)
RBC: 4.68 MIL/uL (ref 3.87–5.11)
RDW: 14.4 % (ref 11.5–15.5)
WBC: 6.8 10*3/uL (ref 4.0–10.5)

## 2013-04-11 LAB — BASIC METABOLIC PANEL
BUN: 11 mg/dL (ref 6–23)
CO2: 25 mEq/L (ref 19–32)
Calcium: 8.9 mg/dL (ref 8.4–10.5)
Chloride: 102 mEq/L (ref 96–112)
Creatinine, Ser: 0.56 mg/dL (ref 0.50–1.10)
GFR calc Af Amer: >90 mL/min (ref >90)
GFR calc non Af Amer: >90 mL/min (ref >90)
Glucose, Bld: 101 mg/dL — ABNORMAL HIGH (ref 70–99)
Potassium: 3.8 mEq/L (ref 3.5–5.1)
Sodium: 136 mEq/L (ref 135–145)

## 2013-04-11 LAB — MAGNESIUM: Magnesium: 2 mg/dL (ref 1.5–2.5)

## 2013-04-11 LAB — TROPONIN I: Troponin I: <0.3 ng/mL (ref <0.30)

## 2013-04-11 NOTE — ED Notes (Signed)
To ED from work via EMS, pt with hx of SVT, was in SVT on EMS arrival, HR 200, SOB, EMS gave 6mg  Adenisone with HR 90 - 100 on arrival, no pain on arrival, is A/O on arrival, 20g left hand, NAD

## 2013-04-11 NOTE — ED Provider Notes (Signed)
CSN: [id number]     Arrival date & time 04/11/13  7829 History   First MD Initiated Contact with Patient 04/11/13 1002     Chief Complaint  Patient presents with  . Palpitations   (Consider location/radiation/quality/duration/timing/severity/associated sxs/prior Treatment) HPI  47 year old female palpitations. Onset shortly before arrival while at work. Prehospital EKG/strips were reviewed. Regular, narrow complex tachycardia with a rate around 200 consistent with SVT. Patient received 6 mg of adenosine by EMS with conversion to sinus rhythm after attempted vagal maneuvers failed.. Patient with a past history of palpitations. Previous evaluation by cardiology for the same. Has worn a monitor in the past with recorded episodes of both atrial tachycardia and SVT. These episodes did not necessarily correlate with her symptoms though. She is currently on a beta blocker which was increased to 25 mg twice a day during her last cardiology visit almost a month ago. She reports compliance with her medicines. Patient with no other complaints. Currently no symptoms. Denies any preceding chest pain.  Past Medical History  Diagnosis Date  . Palpitation     s/p multiple holters - finally found possible SVT - improved after started beta blocker (Dr. Tresa Endo)  . Hx of migraines     with cycle  . Urine incontinence   . Vertigo   . 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 History of UTI     tends to get with relations  . Melanoma in situ of face     gets yearly skin checks [name])  . History of miscarriage     G5P2  . Adhesive capsulitis of shoulder 2014    s/p GH steroid injection  . SVT (supraventricular tachycardia)    Past Surgical History  Procedure Laterality Date  . Hospitalization  2002    urosepsis, again with son   Family History  Problem Relation Age of Onset  . Thyroid disease Mother 54  . Coronary artery disease Father 33    MI   . Hypertension Father   . Cancer Maternal Uncle     unsure  . Mental retardation Maternal Grandmother     bipolar  . Diabetes Neg Hx    History  Substance Use Topics  . Smoking status: Never Smoker   . Smokeless tobacco: Never Used  . Alcohol Use: No   OB History   Grav Para Term Preterm Abortions TAB SAB Ect Mult Living                 Review of Systems  All systems reviewed and negative, other than as noted in HPI.   Allergies  Review of patient's allergies indicates no known allergies.  Home Medications   Current Outpatient Rx  Name  Route  Sig  Dispense  Refill  . metoprolol tartrate (LOPRESSOR) 25 MG tablet   Oral   Take 1 tablet (25 mg total) by mouth 2 (two) times daily.   60 tablet   6   . Multiple Vitamin (MULTIVITAMIN) tablet   Oral   Take 1 tablet by mouth daily.          BP 111/85  Pulse 109  Resp 30  SpO2 99%  LMP 03/23/2013 Physical Exam  Nursing note and vitals reviewed. Constitutional: She appears well-developed and well-nourished. No distress.  HENT:  Head: Normocephalic and atraumatic.  Eyes: Conjunctivae are normal. Right eye exhibits no discharge. Left eye exhibits no discharge.  Neck: Neck supple.  Cardiovascular: Normal rate, regular rhythm and normal heart sounds.  Exam reveals no gallop and no friction rub.   No murmur heard. Pulmonary/Chest: Effort normal and breath sounds normal. No respiratory distress.  Abdominal: Soft. She exhibits no distension. There is no tenderness.  Musculoskeletal: She exhibits no edema and no tenderness.  Neurological: She is alert.  Skin: Skin is warm and dry.  Psychiatric: She has a normal mood and affect. Her behavior is normal. Thought content normal.    ED Course  Procedures (including critical care time) Labs Review Labs Reviewed  BASIC METABOLIC PANEL - Abnormal; Notable for the following:    Glucose, Bld 101 (*)    All other components within normal limits  CBC WITH DIFFERENTIAL -  Abnormal; Notable for the following:    MCH 25.6 (*)    All other components within normal limits  MAGNESIUM  TROPONIN I   Imaging Review No results found.  EKG Interpretation   None      EKG:  Rhythm: sinus tachycardia Vent. rate 107 BPM PR interval 162 ms QRS duration 73 ms QT/QTc 333/445 ms LAE ST segments: NS ST Changes   MDM   1. Supraventricular tachycardia     47 year old female palpitations. SVT prior to arrival with conversion to sinus rhythm with adenosine by EMS. Workup today has been unremarkable. She has cardiology followup. Patient already on Lopressor, 25 mg twice a day which is a recent increase. No repeat events in ED. Reviewed vagal maneuvers and need for cards follow-up.      Raeford Razor, MD 04/11/13 1153

## 2013-04-12 ENCOUNTER — Ambulatory Visit: Payer: BC Managed Care – PPO | Admitting: Cardiology

## 2013-04-12 ENCOUNTER — Encounter: Payer: Self-pay | Admitting: Cardiology

## 2013-04-12 ENCOUNTER — Ambulatory Visit (INDEPENDENT_AMBULATORY_CARE_PROVIDER_SITE_OTHER): Payer: BC Managed Care – PPO | Admitting: Cardiology

## 2013-04-12 VITALS — BP 90/60 | HR 83 | Ht 65.0 in | Wt 164.0 lb

## 2013-04-12 DIAGNOSIS — M7500 Adhesive capsulitis of unspecified shoulder: Secondary | ICD-10-CM

## 2013-04-12 DIAGNOSIS — I471 Supraventricular tachycardia: Secondary | ICD-10-CM

## 2013-04-12 DIAGNOSIS — I498 Other specified cardiac arrhythmias: Secondary | ICD-10-CM

## 2013-04-12 DIAGNOSIS — M7501 Adhesive capsulitis of right shoulder: Secondary | ICD-10-CM

## 2013-04-12 MED ORDER — METOPROLOL TARTRATE 25 MG PO TABS: 25.0000 mg | ORAL_TABLET | Freq: Three times a day (TID) | ORAL | Status: DC

## 2013-04-12 NOTE — Patient Instructions (Signed)
You have been referred to EP service. Your physician recommends that you schedule a follow-up appointment in: 3 months Increase Metoprolol to 25 mg three times daily

## 2013-04-12 NOTE — Assessment & Plan Note (Signed)
Recurrent 10/13 despite increased Metoprolol

## 2013-04-12 NOTE — Assessment & Plan Note (Signed)
Causing lots of stress, can't sleep

## 2013-04-12 NOTE — Progress Notes (Signed)
04/12/2013 Brandi Cherry   [birth year]  [id number]  Primary Physicia Eustaquio Boyden, MD Primary Cardiologist: Dr Tresa Endo  HPI:  Patient is a 47 year old Caucasian female with a history of palpitations as far back as 7 years ago when she was pregnant. She was seen by Dr. [name] at that time and has worn a heart monitor on 2 separate occasions with no evidence for cause of her palpitations. She had a 2D echocardiogram June 2012 which showed a normal ejection fraction, normal diastolic parameters; there was very mild MR and TR. The patient currently works as a Veterinary surgeon at [employer]. A 30-day heart monitor Oct - Nov 2013 showed episodes of atrial tachycardia and at least 1 episode of SVT, maybe about an 8-beat run of it that was on May 12, 2012. It's not clear if her symptoms were related to her short bursts of atrial tachycardia. She was reassured and placed on low dose Metoprolol 12.5 mg BID and did well until a couple a months ago. She developed a "frozen shoulder" this summer and has had lots of problems with this. She has had injections that have not been effective. She says she doesn't sleep well, "3 hours a night". I saw her in the office 03/17/13 for increasing palpitations. Her Metoprolol was increased to 25 mg BID. She did well until 04/11/13 when she had sudden onset of tachycardia at work. This was associated with diaphoresis, SOB, and low B/P. EMS was called. She slowed with Adenosine but was still symptomatic so she was taken to the ER and later released. She is here for follow up. She is somewhat of an anxious person even without any outside stressors. She tells me she is "terrified" her heart will go out of rhythm again. 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  She has decided to take 2 months off on her own "to get straightened out". She denies any use of stimulants or OTC  medications.     Current Outpatient Prescriptions  Medication Sig Dispense Refill  . metoprolol tartrate (LOPRESSOR) 25 MG tablet Take 1 tablet (25 mg total) by mouth 3 (three) times daily.  100 tablet  3  . Multiple Vitamin (MULTIVITAMIN) tablet Take 1 tablet by mouth daily.      . valACYclovir (VALTREX) 500 MG tablet Take 500 mg by mouth daily. Just restarted taking this medication yesterday       Current Facility-Administered Medications  Medication Dose Route Frequency Provider Last Rate Last Dose  . triamcinolone acetonide (KENALOG-40) injection 40 mg  40 mg Intra-articular Once Judi Saa, DO        No Known Allergies  History   Social History  . Marital Status: Married    Spouse Name: N/A    Number of Children: N/A  . Years of Education: N/A   Occupational History  . Not on file.   Social History Main Topics  . Smoking status: Never Smoker   . Smokeless tobacco: Never Used  . Alcohol Use: No  . Drug Use: No  . Sexual Activity: Not on file   Other Topics Concern  . Not on file   Social History Narrative   Caffeine: occasional tea   Lives with husband, son and daughter   Occupation; Clinical biochemist   Edu: Masters   Activity: none currently   Diet: good water, daily fruits/vegetables,  red meat 1x/wk, fish 2x/wk     Review of Systems: General: negative for chills, fever, night sweats or weight changes.  Cardiovascular: negative for chest pain, dyspnea on exertion, edema, orthopnea, palpitations, paroxysmal nocturnal dyspnea or shortness of breath Dermatological: negative for rash Respiratory: negative for cough or wheezing Urologic: negative for hematuria Abdominal: negative for nausea, vomiting, diarrhea, bright red blood per rectum, melena, or hematemesis Neurologic: negative for visual changes, syncope, or dizziness All other systems reviewed and are otherwise negative except as noted above.    Blood pressure 90/60, pulse 83, height 5\' 5"  (1.651  m), weight 164 lb (74.39 kg), last menstrual period 03/23/2013.  General appearance: alert, cooperative and no distress Neck: no carotid bruit, no JVD, supple, symmetrical, trachea midline and thyroid not enlarged, symmetric, no tenderness/mass/nodules Lungs: clear to auscultation bilaterally Heart: regular rate and rhythm Extremities: extremities normal, atraumatic, no cyanosis or edema Pulses: 2+ and symmetric Skin: Skin color, texture, turgor normal. No rashes or lesions Neurologic: Grossly normal  EKG NSR, QTc 425, no Delta wave  ASSESSMENT AND PLAN:   PAT - short runs documented on her last monitor but they did not corrolate with symptoms Recurrent 10/13 despite increased Metoprolol  Frozen shoulder Causing lots of stress, can't sleep   PLAN  I discussed the pt with Dr Tresa Endo and we feel she should be referred to EP for consideration of RFA. In her case sooner would best. I increased her Metoprolol to 25 mg TID (B/P is soft) and asked her to push fluids. I did order a TSH today, labs from her ER visit were WNL.  Brandi Cherry KPA-C 04/12/2013 3:04 PM

## 2013-04-13 ENCOUNTER — Telehealth: Payer: Self-pay | Admitting: Cardiology

## 2013-04-13 LAB — TSH: TSH: 2.398 u[IU]/mL (ref 0.350–4.500)

## 2013-04-13 NOTE — Telephone Encounter (Signed)
Please call.

## 2013-04-13 NOTE — Telephone Encounter (Signed)
I have talked with this pt. About her appt. With EP and told her we didn't have any sway over the EP schedulers and that Nov. 4 was not an unreasonable time frame to be seen with her problem.

## 2013-04-14 ENCOUNTER — Telehealth: Payer: Self-pay | Admitting: Cardiovascular Disease

## 2013-04-14 NOTE — Telephone Encounter (Signed)
Patient called stating she has an appt at [facility] with Dr. Wynelle Link tomorrow 04/15/13 at 1:20.  I have faxed her records to Dr. Chase Caller office today 04/14/13. ST

## 2013-04-30 DIAGNOSIS — I4719 Other supraventricular tachycardia: Secondary | ICD-10-CM

## 2013-04-30 DIAGNOSIS — I471 Supraventricular tachycardia: Secondary | ICD-10-CM

## 2013-04-30 HISTORY — PX: CARDIAC ELECTROPHYSIOLOGY STUDY AND ABLATION: SHX1294

## 2013-04-30 HISTORY — DX: Supraventricular tachycardia: I47.1

## 2013-04-30 HISTORY — DX: Other supraventricular tachycardia: I47.19

## 2013-05-03 ENCOUNTER — Institutional Professional Consult (permissible substitution): Payer: BC Managed Care – PPO | Admitting: Internal Medicine

## 2013-05-04 ENCOUNTER — Telehealth: Payer: Self-pay | Admitting: Cardiovascular Disease

## 2013-05-04 NOTE — Telephone Encounter (Signed)
Thanks

## 2013-05-04 NOTE — Telephone Encounter (Signed)
Just wanted to let JC and Franky Macho know that she went to [facility] for ablation on Monday.  Home and doing well.  Wanted to say thank you for all your concern and help.

## 2013-05-30 HISTORY — PX: DILATION AND CURETTAGE OF UTERUS: SHX78

## 2013-06-10 ENCOUNTER — Telehealth: Payer: Self-pay | Admitting: Family Medicine

## 2013-06-10 NOTE — Telephone Encounter (Signed)
Patient Information:  Caller Name: Asley  Phone: [phone]  Patient: Brandi Cherry, Brandi Cherry  Gender: Female  DOB: [birth year]  Age: 47 Years  PCP: Eustaquio Boyden [facility])  Pregnant: No  Office Follow Up:  Does the office need to follow up with this patient?: No  Instructions For The Office: N/A   Symptoms  Reason For Call & Symptoms: Pt is calling to say her tongue is very sore at the right side on the back of the tongue. Her tongue has white area that can't be scraped off. Pt didn't know if she should see her dentist or PCP. Onset 1 1/2 weeks ago. ONset 05/28/13. She has been working with the dentist by phone Francis Dowse also has swollen gums they saw her for in [place]. With the tongue they advised she clean/gargle with salt water QID. She has been trying that. It has not worsened. Pt will call back for appt on Monday/she wants to speak with her dentist first.  Reviewed Health History In EMR: Yes  Reviewed Medications In EMR: Yes  Reviewed Allergies In EMR: Yes  Reviewed Surgeries / Procedures: Yes  Date of Onset of Symptoms: 05/28/2013  Treatments Tried: salt water rinses  Treatments Tried Worked: No OB / GYN:  LMP: Unknown  Guideline(s) Used:  Mouth Injury  No Protocol Available - Sick Adult  Disposition Per Guideline:   See Within 3 Days in Office  Reason For Disposition Reached:   Nursing judgment  Advice Given:  N/A  Patient Will Follow Care Advice:  YES

## 2013-06-10 NOTE — Telephone Encounter (Signed)
Noted  

## 2013-06-13 ENCOUNTER — Ambulatory Visit (INDEPENDENT_AMBULATORY_CARE_PROVIDER_SITE_OTHER): Payer: BC Managed Care – PPO | Admitting: Family Medicine

## 2013-06-13 ENCOUNTER — Encounter: Payer: Self-pay | Admitting: Family Medicine

## 2013-06-13 VITALS — BP 110/78 | HR 96 | Temp 99.0°F | Wt 167.8 lb

## 2013-06-13 DIAGNOSIS — K148 Other diseases of tongue: Secondary | ICD-10-CM | POA: Insufficient documentation

## 2013-06-13 NOTE — Assessment & Plan Note (Signed)
Unclear etiology - I did suggest she check with dentist and update me with his opinions.

## 2013-06-13 NOTE — Progress Notes (Signed)
   Subjective:    Patient ID: Brandi Cherry, female    DOB: 04-29-1966, 47 y.o.   MRN: [id number]  HPI CC: mouth concerns  AVNRT on monitor at ER after latest episode of palpitations.  Has seen EP at [facility] - seeing Dr. Constance Haw.  Had ablation at [facility]  Pending 6 wk f/u next week.  Has also had uterine surgery - for uterine polyp with menorrhagia.  S/p d&c (Dr. Rana Snare at [facility]).  propofol used.  This was Wednesday.  Out of work since 03/2013.    Saw dentist Oct 27th, told had bumps on tip of tongue.  Told to rinse with warm salt water.  Since then, noticing right lateral tongue with white coating.  Also noticing red burning spot on left tongue.  No fevers/chills, skin rashes, nausea.  Taste sensation intact.  No eye redness.  No genital lesions. H/o canker sores in the past.  Past Medical History  Diagnosis Date  . Hx of migraines     with cycle  . Urine incontinence   . Vertigo   . 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 History of UTI     tends to get with relations  . Melanoma in situ of face     gets yearly skin checks [name])  . History of miscarriage     G5P2  . Adhesive capsulitis of shoulder 2014    s/p GH steroid injection  . 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 Constance Haw at [facility])    Review of Systems Per HPI    Objective:   Physical Exam  Nursing note and vitals reviewed. Constitutional: She appears well-developed and well-nourished. No distress.  HENT:  Right lateral tongue with white patch that does not scrape off Left lateral tongue with small erythematous erosions, slightly tender Some loss of papillae left dorsal tongue       Assessment & Plan:

## 2013-06-13 NOTE — Progress Notes (Signed)
Pre-visit discussion using our clinic review tool. No additional management support is needed unless otherwise documented below in the visit note.  

## 2013-06-13 NOTE — Patient Instructions (Addendum)
I'd suggest restarting multivitamin. Let's see what dentist thinks.  Let me know what he thinks. Could try oragel as needed for discomfort.

## 2013-06-30 DIAGNOSIS — M1990 Unspecified osteoarthritis, unspecified site: Secondary | ICD-10-CM

## 2013-06-30 HISTORY — DX: Unspecified osteoarthritis, unspecified site: M19.90

## 2013-08-29 ENCOUNTER — Other Ambulatory Visit: Payer: Self-pay | Admitting: Obstetrics and Gynecology

## 2013-08-29 DIAGNOSIS — R928 Other abnormal and inconclusive findings on diagnostic imaging of breast: Secondary | ICD-10-CM

## 2013-09-01 ENCOUNTER — Ambulatory Visit
Admission: RE | Admit: 2013-09-01 | Discharge: 2013-09-01 | Disposition: A | Discharge status: Home / Self Care | Payer: BC Managed Care – PPO | Source: Ambulatory Visit | Attending: Obstetrics and Gynecology | Admitting: Obstetrics and Gynecology

## 2013-09-01 ENCOUNTER — Ambulatory Visit
Admission: RE | Admit: 2013-09-01 | Discharge: 2013-09-01 | Disposition: A | Discharge status: Home / Self Care | Payer: Self-pay | Source: Ambulatory Visit | Attending: Obstetrics and Gynecology | Admitting: Obstetrics and Gynecology

## 2013-09-01 DIAGNOSIS — R928 Other abnormal and inconclusive findings on diagnostic imaging of breast: Secondary | ICD-10-CM

## 2013-09-08 ENCOUNTER — Other Ambulatory Visit: Payer: BC Managed Care – PPO

## 2013-12-29 ENCOUNTER — Other Ambulatory Visit: Payer: Self-pay | Admitting: Obstetrics and Gynecology

## 2013-12-29 DIAGNOSIS — N6452 Nipple discharge: Secondary | ICD-10-CM

## 2014-01-11 ENCOUNTER — Ambulatory Visit
Admission: RE | Admit: 2014-01-11 | Discharge: 2014-01-11 | Disposition: A | Discharge status: Home / Self Care | Payer: BC Managed Care – PPO | Source: Ambulatory Visit | Attending: Obstetrics and Gynecology | Admitting: Obstetrics and Gynecology

## 2014-01-11 ENCOUNTER — Other Ambulatory Visit: Payer: Self-pay | Admitting: Obstetrics and Gynecology

## 2014-01-11 DIAGNOSIS — N6452 Nipple discharge: Secondary | ICD-10-CM

## 2014-02-13 ENCOUNTER — Ambulatory Visit
Admission: RE | Admit: 2014-02-13 | Discharge: 2014-02-13 | Disposition: A | Discharge status: Home / Self Care | Payer: BC Managed Care – PPO | Source: Ambulatory Visit | Attending: Obstetrics and Gynecology | Admitting: Obstetrics and Gynecology

## 2014-02-13 DIAGNOSIS — N6452 Nipple discharge: Secondary | ICD-10-CM

## 2014-02-23 ENCOUNTER — Encounter: Payer: Self-pay | Admitting: Family Medicine

## 2014-02-23 ENCOUNTER — Ambulatory Visit (INDEPENDENT_AMBULATORY_CARE_PROVIDER_SITE_OTHER): Payer: BC Managed Care – PPO | Admitting: Family Medicine

## 2014-02-23 ENCOUNTER — Ambulatory Visit (INDEPENDENT_AMBULATORY_CARE_PROVIDER_SITE_OTHER)
Admission: RE | Admit: 2014-02-23 | Discharge: 2014-02-23 | Disposition: A | Discharge status: Home / Self Care | Payer: BC Managed Care – PPO | Source: Ambulatory Visit | Attending: Family Medicine | Admitting: Family Medicine

## 2014-02-23 VITALS — BP 110/70 | HR 96 | Temp 98.3°F | Wt 170.0 lb

## 2014-02-23 DIAGNOSIS — I471 Supraventricular tachycardia: Secondary | ICD-10-CM

## 2014-02-23 DIAGNOSIS — M79672 Pain in left foot: Secondary | ICD-10-CM

## 2014-02-23 DIAGNOSIS — I498 Other specified cardiac arrhythmias: Secondary | ICD-10-CM

## 2014-02-23 DIAGNOSIS — M791 Myalgia, unspecified site: Secondary | ICD-10-CM

## 2014-02-23 DIAGNOSIS — M79671 Pain in right foot: Secondary | ICD-10-CM | POA: Insufficient documentation

## 2014-02-23 DIAGNOSIS — IMO0001 Reserved for inherently not codable concepts without codable children: Secondary | ICD-10-CM

## 2014-02-23 DIAGNOSIS — M19049 Primary osteoarthritis, unspecified hand: Secondary | ICD-10-CM

## 2014-02-23 DIAGNOSIS — M79609 Pain in unspecified limb: Secondary | ICD-10-CM

## 2014-02-23 NOTE — Progress Notes (Signed)
Pre visit review using our clinic review tool, if applicable. No additional management support is needed unless otherwise documented below in the visit note. 

## 2014-02-23 NOTE — Progress Notes (Addendum)
BP 110/70  Pulse 96  Temp(Src) 98.3 F (36.8 C) (Oral)  Wt 170 lb (77.111 kg)  LMP 01/30/2014   CC: headache and arthralgias  Subjective:    Patient ID: Brandi Cherry, female    DOB: [birth year], 48 y.o.   MRN: [id number]  HPI: Brandi Cherry is a 48 y.o. female presenting on 02/23/2014 for Headache and Joint Pain   Over last year noticing increased stiffness in hand, as well as soreness and swelling of several IPs (mainly R thumb, L middle finger). Had some erythema and warmth of L DIP. Over last 4 months noticing some tightness/stiffness of feet from heel to calf. Initially discomfort at knees but now better. Worse pain with squeezing calcaneus bilateral feet.   Recent cruise over summer - worse foot pain and stiffness with stairs. Some burning aching leg and calf pains as well. Some chills this morning. Tmax this morning 100.   Told has high arch in past, was using flip flops which seemed to aggravate pain.  No skin rash noted or h/o psoraisis. No abd pain or nausea.   AVNRT s/p ablation by Dr. Nancy Fetter at [facility] 04/2013. Since then no more tachycardias.    Fmhx thyroid disease and some arthritis but not rheumatoid.  States had recent comprehensive labwork by GYN including CBC and TSH and FLP overall normal. Declines pain med for now.  Relevant past medical, surgical, family and social history reviewed and updated as indicated.  Allergies and medications reviewed and updated. Current Outpatient Prescriptions on File Prior to Visit  Medication Sig  . Multiple Vitamin (MULTIVITAMIN) tablet Take 1 tablet by mouth daily.  . valACYclovir (VALTREX) 500 MG tablet Take 500 mg by mouth daily as needed. Just restarted taking this medication yesterday   No current facility-administered medications on file prior to visit.   Past Medical History  Diagnosis Date  . Hx of migraines     with cycle  . Urine incontinence   . Vertigo   . HSV-2 (herpes simplex virus 2) infection     on L  lateral thigh only, dx by GYN with swab  . History of UTI     tends to get with relations  . Melanoma in situ of face     gets yearly skin checks Brandi Cherry)  . History of miscarriage     G5P2  . Adhesive capsulitis of shoulder 2014    s/p GH steroid injection  . 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 Teena Dunk at [facility])    Review of Systems Per HPI unless specifically indicated above    Objective:    BP 110/70  Pulse 96  Temp(Src) 98.3 F (36.8 C) (Oral)  Wt 170 lb (77.111 kg)  LMP 01/30/2014  Physical Exam  Nursing note and vitals reviewed. Constitutional: She appears well-developed and well-nourished. No distress.  HENT:  Mouth/Throat: Oropharynx is clear and moist. No oropharyngeal exudate.  Neck: Normal range of motion. Neck supple. No thyromegaly present.  Cardiovascular: Normal rate, regular rhythm, normal heart sounds and intact distal pulses.   No murmur heard. Pulmonary/Chest: Effort normal and breath sounds normal. No respiratory distress. She has no wheezes. She has no rales.  Musculoskeletal: She exhibits no edema.  Tender to palpation 1st IP joint R hand and 3rd DIP L hand with evident erythematous nodules at DIP. No pain at elbows, shoulders, wrists. No active synovitis. Tender to palpation L heel at insertion of achilles tendon, tender to  palpation R heel at retrocalcaneal bursa Negative calcaneal squeeze test bilaterally  Lymphadenopathy:    She has no cervical adenopathy.  Skin: Skin is warm and dry. No rash noted.  Psychiatric: She has a normal mood and affect.   Results for orders placed in visit on 02/23/14  ANA      Result Value Ref Range   ANA NEG  NEGATIVE  HIGH SENSITIVITY CRP      Result Value Ref Range   CRP, High Sensitivity 1.900  0.000 - 5.000 mg/L  RHEUMATOID FACTOR      Result Value Ref Range   Rheumatoid Factor <10  <=14 IU/mL  CK      Result Value Ref Range   Total CK 46  7 - 177 U/L  TSH      Result  Value Ref Range   TSH 1.85  0.35 - 4.50 uIU/mL  COMPREHENSIVE METABOLIC PANEL      Result Value Ref Range   Sodium 135  135 - 145 mEq/L   Potassium 3.7  3.5 - 5.1 mEq/L   Chloride 104  96 - 112 mEq/L   CO2 22  19 - 32 mEq/L   Glucose, Bld 83  70 - 99 mg/dL   BUN 7  6 - 23 mg/dL   Creatinine, Ser 0.6  0.4 - 1.2 mg/dL   Total Bilirubin 0.4  0.2 - 1.2 mg/dL   Alkaline Phosphatase 59  39 - 117 U/L   AST 23  0 - 37 U/L   ALT 14  0 - 35 U/L   Total Protein 7.8  6.0 - 8.3 g/dL   Albumin 4.1  3.5 - 5.2 g/dL   Calcium 8.9  8.4 - 10.5 mg/dL   GFR 118.36  >60.00 mL/min      Assessment & Plan:   Problem List Items Addressed This Visit   AVNRT (AV nodal re-entry tachycardia)     Resolved s/p ablation 04/2013 by Dr. Nancy Fetter at [facility]    Hand arthritis - Primary     No psoriasis hx, location of arthritis not consistent with rheumatism. However does endorse am stiffness and fatigue along with other systemic sxs. Check labwork today including ANA, RF, TSH and CMP. Check CRP. Anticipate osteoarthritis - pt declines medication for pain today. Check xray of left hand to further evaluate joint damage and nodules noted today.    Relevant Orders      ANA (Completed)      High sensitivity CRP (Completed)      Rheumatoid factor (Completed)      DG Hand Complete Left (Completed)      TSH (Completed)      Comprehensive metabolic panel (Completed)   Heel pain, bilateral     Anticipate achilles enthesopathy of R heel along with retrocalcaneal bursitis on left. Recommended heel cushions in bilateral shoes, discussed importance of supportive footwear - rec against flip flops and high heels.     Other Visit Diagnoses   Myalgia        Relevant Orders       CK (Completed)       TSH (Completed)       Comprehensive metabolic panel (Completed)        Follow up plan: Return as needed.

## 2014-02-23 NOTE — Patient Instructions (Signed)
For feet - i think left foot has bursitis but right foot seems to be more inflammation of insertion of achilles tendon. I recommend heel cushion in both feet, use supportive shoes like you're doing, avoid flip flops and high heels.  For hands - arthritis likely ?osteo. Ok to continue advil - check xrays today. Blood work today as well

## 2014-02-24 LAB — COMPREHENSIVE METABOLIC PANEL
ALBUMIN: 4.1 g/dL (ref 3.5–5.2)
ALT: 14 U/L (ref 0–35)
AST: 23 U/L (ref 0–37)
Alkaline Phosphatase: 59 U/L (ref 39–117)
BUN: 7 mg/dL (ref 6–23)
CALCIUM: 8.9 mg/dL (ref 8.4–10.5)
CHLORIDE: 104 meq/L (ref 96–112)
CO2: 22 mEq/L (ref 19–32)
Creatinine, Ser: 0.6 mg/dL (ref 0.4–1.2)
GFR: 118.36 mL/min (ref >60.00)
GLUCOSE: 83 mg/dL (ref 70–99)
POTASSIUM: 3.7 meq/L (ref 3.5–5.1)
Sodium: 135 mEq/L (ref 135–145)
Total Bilirubin: 0.4 mg/dL (ref 0.2–1.2)
Total Protein: 7.8 g/dL (ref 6.0–8.3)

## 2014-02-24 LAB — TSH: TSH: 1.85 u[IU]/mL (ref 0.35–4.50)

## 2014-02-24 LAB — CK: Total CK: 46 U/L (ref 7–177)

## 2014-02-24 LAB — HIGH SENSITIVITY CRP: CRP, High Sensitivity: 1.9 mg/L (ref 0.000–5.000)

## 2014-02-24 LAB — ANA: Anti Nuclear Antibody(ANA): NEGATIVE

## 2014-02-24 LAB — RHEUMATOID FACTOR: Rhuematoid fact SerPl-aCnc: <10 IU/mL (ref <=14)

## 2014-02-24 NOTE — Assessment & Plan Note (Signed)
Anticipate achilles enthesopathy of R heel along with retrocalcaneal bursitis on left. Recommended heel cushions in bilateral shoes, discussed importance of supportive footwear - rec against flip flops and high heels.

## 2014-02-24 NOTE — Assessment & Plan Note (Addendum)
No psoriasis hx, location of arthritis not consistent with rheumatism. However does endorse am stiffness and fatigue along with other systemic sxs. Check labwork today including ANA, RF, TSH and CMP. Check CRP. Anticipate osteoarthritis - pt declines medication for pain today. Check xray of left hand to further evaluate joint damage and nodules noted today.

## 2014-02-24 NOTE — Assessment & Plan Note (Signed)
Resolved s/p ablation 04/2013 by Dr. Nancy Fetter at [facility]

## 2014-02-25 ENCOUNTER — Encounter: Payer: Self-pay | Admitting: Family Medicine

## 2014-03-15 ENCOUNTER — Encounter (INDEPENDENT_AMBULATORY_CARE_PROVIDER_SITE_OTHER): Payer: Self-pay

## 2014-03-15 ENCOUNTER — Ambulatory Visit (INDEPENDENT_AMBULATORY_CARE_PROVIDER_SITE_OTHER): Payer: BC Managed Care – PPO | Admitting: Family Medicine

## 2014-03-15 ENCOUNTER — Encounter: Payer: Self-pay | Admitting: Family Medicine

## 2014-03-15 VITALS — BP 112/68 | HR 106 | Temp 98.9°F | Wt 166.8 lb

## 2014-03-15 DIAGNOSIS — J069 Acute upper respiratory infection, unspecified: Secondary | ICD-10-CM

## 2014-03-15 MED ORDER — AMOXICILLIN 875 MG PO TABS: 875.0000 mg | ORAL_TABLET | Freq: Two times a day (BID) | ORAL | Status: DC

## 2014-03-15 MED ORDER — HYDROCODONE-HOMATROPINE 5-1.5 MG/5ML PO SYRP: 5.0000 mL | ORAL_SOLUTION | Freq: Three times a day (TID) | ORAL | Status: DC | PRN

## 2014-03-15 NOTE — Progress Notes (Signed)
Pre visit review using our clinic review tool, if applicable. No additional management support is needed unless otherwise documented below in the visit note. 

## 2014-03-15 NOTE — Progress Notes (Signed)
SUBJECTIVE:  Brandi Cherry is a 48 y.o. female pt of Dr. Darnell Level, new to me, who complains of coryza, congestion, sore throat and bilateral sinus pain, fever for 5 days. She denies a history of anorexia, chest pain, fatigue, shortness of breath, vomiting and weakness and denies a history of asthma. Patient denies smoke cigarettes.    Current Outpatient Prescriptions on File Prior to Visit  Medication Sig Dispense Refill  . ibuprofen (ADVIL,MOTRIN) 200 MG tablet Take 400 mg by mouth every 6 (six) hours as needed.      . Multiple Vitamin (MULTIVITAMIN) tablet Take 1 tablet by mouth daily.      . valACYclovir (VALTREX) 500 MG tablet Take 500 mg by mouth daily as needed. Just restarted taking this medication yesterday       No current facility-administered medications on file prior to visit.    No Known Allergies  Past Medical History  Diagnosis Date  . Hx of migraines     with cycle  . Urine incontinence   . Vertigo   . 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 History of UTI     tends to get with relations  . Melanoma in situ of face     gets yearly skin checks [name])  . History of miscarriage     G5P2  . Adhesive capsulitis of shoulder 2014    s/p GH steroid injection  . 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 Teena Dunk at [facility])  . 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 Hospitalization  2002    urosepsis, again with son  . Cardiac electrophysiology study and ablation  04/2013    AVNRT (EP Dr. Teena Dunk at [facility])  . Dilation and curettage of uterus  05/2013    uterine polyp with menorrhagia    Family History  Problem Relation Age of Onset  . Thyroid disease Mother 57  . Coronary artery disease Father 22    MI  . Hypertension Father   . Cancer Maternal Uncle     unsure  . Mental retardation Maternal Grandmother     bipolar  . Diabetes  Neg Hx   . Arthritis Other     strong on mother's side, no RA though    History   Social History  . Marital Status: Married    Spouse Name: N/A    Number of Children: N/A  . Years of Education: N/A   Occupational History  . Not on file.   Social History Main Topics  . Smoking status: Never Smoker   . Smokeless tobacco: Never Used  . Alcohol Use: No  . Drug Use: No  . Sexual Activity: Not on file   Other Topics Concern  . Not on file   Social History Narrative   Caffeine: occasional tea   Lives with husband, son and daughter   Occupation; Animal nutritionist at [employer]   Edu: Masters   Activity: none currently   Diet: good water, daily fruits/vegetables, red meat 1x/wk, fish 2x/wk   The PMH, PSH, Social History, Family History, Medications, and allergies have been reviewed in [facility], and have been updated if relevant.  OBJECTIVE: BP 112/68  Pulse 106  Temp(Src) 98.9 F (37.2 C) (Oral)  Wt 166 lb 12 oz (75.637 kg)  SpO2 98%  LMP 03/10/2014  She appears well, vital signs are as noted. Ears normal.  Throat and pharynx normal.  Neck supple. No adenopathy in the neck. Nose is congested. Sinuses non tender. The chest is clear, without wheezes or rales.  ASSESSMENT:  viral upper respiratory illness  PLAN: Symptomatic therapy suggested: push fluids, rest and return office visit prn if symptoms persist or worsen. Lack of antibiotic effectiveness discussed with her. Did print out rx for amoxicillin if symptoms progress.  Call or return to clinic prn if these symptoms worsen or fail to improve as anticipated.

## 2014-03-15 NOTE — Patient Instructions (Signed)
Great to meet you. Continue supportive care with Tylenol, warm tea.  Hycodan as needed at bedtime for cough.  Fill prescription for amoxicillin if your symptoms do not improve over next couple of days.

## 2014-05-24 ENCOUNTER — Ambulatory Visit
Admission: RE | Admit: 2014-05-24 | Discharge: 2014-05-24 | Disposition: A | Discharge status: Home / Self Care | Payer: BC Managed Care – PPO | Source: Ambulatory Visit | Attending: Obstetrics and Gynecology | Admitting: Obstetrics and Gynecology

## 2014-05-24 ENCOUNTER — Other Ambulatory Visit: Payer: Self-pay | Admitting: Obstetrics and Gynecology

## 2014-05-24 DIAGNOSIS — R234 Changes in skin texture: Secondary | ICD-10-CM

## 2014-07-17 ENCOUNTER — Encounter: Payer: Self-pay | Admitting: Family Medicine

## 2014-07-17 ENCOUNTER — Ambulatory Visit (INDEPENDENT_AMBULATORY_CARE_PROVIDER_SITE_OTHER)
Admission: RE | Admit: 2014-07-17 | Discharge: 2014-07-17 | Disposition: A | Discharge status: Home / Self Care | Payer: BC Managed Care – PPO | Source: Ambulatory Visit | Attending: Family Medicine | Admitting: Family Medicine

## 2014-07-17 ENCOUNTER — Ambulatory Visit (INDEPENDENT_AMBULATORY_CARE_PROVIDER_SITE_OTHER): Payer: BC Managed Care – PPO | Admitting: Family Medicine

## 2014-07-17 VITALS — BP 114/76 | HR 80 | Temp 98.2°F | Wt 155.5 lb

## 2014-07-17 DIAGNOSIS — R0781 Pleurodynia: Secondary | ICD-10-CM

## 2014-07-17 NOTE — Progress Notes (Signed)
Pre visit review using our clinic review tool, if applicable. No additional management support is needed unless otherwise documented below in the visit note.  L rib pain. Went for mammogram spring 2015.  Had f/u imaging for possible abnormality, u/s was neg at that time.   Then had nipple discharge, but f/u imaging was neg.  She has had L sided chest wall tenderness, under the L breast.   She intentionally lost weight with diet over the last few months.  Recently noted that the L lower anterior ribs looked different from the R lower ribs.   "It could have been there my whole life, but I didn't know so I wanted it checked out."   She doesn't have breast sx now.  No rash.  No trauma.  Tender to the touch but not sore o/w.  No pain with deep breath, rolling over in the bed.   Meds, vitals, and allergies reviewed.   ROS: See HPI.  Otherwise, noncontributory.  nad ncat Neck supple, no LA rrr ctab abd soft, not ttp L lower rib slightly ttp anteriorly.  The tissue deep to the rib isn't ttp- it's the rib itself that is sore.

## 2014-07-17 NOTE — Patient Instructions (Signed)
Go to the lab on the way out.  We'll contact you with your xray report. Take care.  Glad to see you.   

## 2014-07-18 ENCOUNTER — Telehealth: Payer: Self-pay

## 2014-07-18 DIAGNOSIS — R0781 Pleurodynia: Secondary | ICD-10-CM | POA: Insufficient documentation

## 2014-07-18 NOTE — Telephone Encounter (Signed)
Pt left v/m requesting cb about CXR results at [phone] today after 1:30 PM.

## 2014-07-18 NOTE — Assessment & Plan Note (Signed)
Benign exam o/w, except for the tenderness of the rib and the slightly prominence of the area.  Check plain films today, if neg then would only observe.  Routed to PCP as FYI.

## 2014-07-18 NOTE — Telephone Encounter (Signed)
See result note.  

## 2014-07-27 ENCOUNTER — Ambulatory Visit (INDEPENDENT_AMBULATORY_CARE_PROVIDER_SITE_OTHER): Payer: BC Managed Care – PPO | Admitting: Family Medicine

## 2014-07-27 ENCOUNTER — Encounter: Payer: Self-pay | Admitting: Family Medicine

## 2014-07-27 VITALS — BP 112/72 | HR 100 | Temp 98.1°F | Wt 157.5 lb

## 2014-07-27 DIAGNOSIS — R0781 Pleurodynia: Secondary | ICD-10-CM

## 2014-07-27 MED ORDER — DICLOFENAC SODIUM 1 % TD GEL: 1.0000 "application " | Freq: Three times a day (TID) | TRANSDERMAL | Status: DC

## 2014-07-27 MED ORDER — NAPROXEN 500 MG PO TABS: ORAL_TABLET | ORAL | Status: DC

## 2014-07-27 NOTE — Progress Notes (Signed)
Pre visit review using our clinic review tool, if applicable. No additional management support is needed unless otherwise documented below in the visit note. 

## 2014-07-27 NOTE — Progress Notes (Signed)
BP 112/72 mmHg  Pulse 100  Temp(Src) 98.1 F (36.7 C) (Oral)  Wt 157 lb 8 oz (71.442 kg)  LMP 07/13/2014   CC: f/u lump on ribcage  Subjective:    Patient ID: Brandi Cherry, female    DOB: Dec 09, 1965, 49 y.o.   MRN: [id number]  HPI: Brandi Cherry is a 49 y.o. female presenting on 07/27/2014 for Follow-up   Reviewed Dr Josefine Class note. Seen 07/17/2014 with painful L ribcage s/p normal xrays. Noted slight prominence of R ribcage area. This was noted after she recently lost about 20 lbs (was trying) and she noticed left side more prominent.  Main issue is when certain underwire of bra digs into rib.  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 Saw derm and plastic surgeon who did not recommend any treatment for this.   CHEST 2 VIEW COMPARISON: None. FINDINGS: The heart size and mediastinal contours are within normal limits. Both lungs are clear. The visualized skeletal structures are unremarkable. IMPRESSION: No active cardiopulmonary disease. Electronically Signed  By: Maryclare Bean M.D.  On: 07/17/2014 17:31  Relevant past medical, surgical, family and social history reviewed and updated as indicated. Interim medical history since our last visit reviewed. Allergies and medications reviewed and updated. Current Outpatient Prescriptions on File Prior to Visit  Medication Sig  . ibuprofen (ADVIL,MOTRIN) 200 MG tablet Take 400 mg by mouth every 6 (six) hours as needed.  . Multiple Vitamin (MULTIVITAMIN) tablet Take 1 tablet by mouth daily.  . valACYclovir (VALTREX) 500 MG tablet Take 500 mg by mouth daily as needed.    No current facility-administered medications on file prior to visit.    Review of Systems Per HPI unless specifically indicated above     Objective:    BP 112/72 mmHg  Pulse 100  Temp(Src) 98.1 F (36.7 C) (Oral)  Wt 157 lb 8 oz (71.442 kg)  LMP 07/13/2014  Wt Readings from Last 3 Encounters:  07/27/14 157 lb 8 oz (71.442 kg)  07/17/14 155 lb 8 oz (70.534 kg)    03/15/14 166 lb 12 oz (75.637 kg)    Physical Exam  Constitutional: She appears well-developed and well-nourished. No distress.  HENT:  1cm hardened nodule L forehead, unchanging  Pulmonary/Chest: She exhibits mass, tenderness, bony tenderness and swelling.    Tender swelling left anterior costochondral junction at lower ribs  Musculoskeletal: She exhibits no edema.  Nursing note and vitals reviewed.  Results for orders placed or performed in visit on 02/23/14  ANA  Result Value Ref Range   ANA Ser Ql NEG NEGATIVE  High sensitivity CRP  Result Value Ref Range   CRP, High Sensitivity 1.900 0.000 - 5.000 mg/L  Rheumatoid factor  Result Value Ref Range   Rhuematoid fact SerPl-aCnc <10 <=14 IU/mL  CK  Result Value Ref Range   Total CK 46 7 - 177 U/L  TSH  Result Value Ref Range   TSH 1.85 0.35 - 4.50 uIU/mL  Comprehensive metabolic panel  Result Value Ref Range   Sodium 135 135 - 145 mEq/L   Potassium 3.7 3.5 - 5.1 mEq/L   Chloride 104 96 - 112 mEq/L   CO2 22 19 - 32 mEq/L   Glucose, Bld 83 70 - 99 mg/dL   BUN 7 6 - 23 mg/dL   Creatinine, Ser 0.6 0.4 - 1.2 mg/dL   Total Bilirubin 0.4 0.2 - 1.2 mg/dL   Alkaline Phosphatase 59 39 - 117 U/L   AST 23 0 - 37 U/L  ALT 14 0 - 35 U/L   Total Protein 7.8 6.0 - 8.3 g/dL   Albumin 4.1 3.5 - 5.2 g/dL   Calcium 8.9 8.4 - 10.5 mg/dL   GFR 118.36 >60.00 mL/min      Assessment & Plan:   Problem List Items Addressed This Visit    Rib pain - Primary    Actually consistent with inflammatory costochondritis of left lower ribcage. Treat with voltaren gel (or naprosyn which she has at home if insurance will not cover voltaren), ice/heating pad. Pt will update [place] in 1-2 wks with effect. If persistent I will touch base with SM for further eval. Pt agrees with plan.          Follow up plan: Return if symptoms worsen or fail to improve.

## 2014-07-27 NOTE — Assessment & Plan Note (Signed)
Actually consistent with inflammatory costochondritis of left lower ribcage. Treat with voltaren gel (or naprosyn which she has at home if insurance will not cover voltaren), ice/heating pad. Pt will update [place] in 1-2 wks with effect. If persistent I will touch base with SM for further eval. Pt agrees with plan.

## 2014-07-27 NOTE — Patient Instructions (Signed)
I think this is swelling of your joint between ribs and cartilage left lower ribcage. Treat with ice and heating pad whichever soothes better. Use voltaren gel twice daily or naprosyn twice daily with food for 1 week. Update me in 1-2 weeks with effect. If persistent we will discuss plan.

## 2014-07-28 ENCOUNTER — Telehealth: Payer: Self-pay | Admitting: *Deleted

## 2014-07-28 NOTE — Telephone Encounter (Signed)
PA for Voltaren gel in your IN box for completion.

## 2014-07-28 NOTE — Telephone Encounter (Signed)
Filled and in Kim's box. 

## 2014-07-31 NOTE — Telephone Encounter (Signed)
Patient left a voicemail that [facility] is waiting to hear back about the medication that was prescribed last week because insurance is still pending for this to be filled. Please advise patient the status.

## 2014-07-31 NOTE — Telephone Encounter (Signed)
Form faxed. Will await determination. 

## 2014-08-03 NOTE — Telephone Encounter (Signed)
PA approved. Pharmacy and patient aware.

## 2014-08-11 ENCOUNTER — Other Ambulatory Visit (HOSPITAL_COMMUNITY): Payer: Self-pay | Admitting: Obstetrics and Gynecology

## 2014-08-11 DIAGNOSIS — R222 Localized swelling, mass and lump, trunk: Secondary | ICD-10-CM

## 2014-08-15 ENCOUNTER — Ambulatory Visit (HOSPITAL_COMMUNITY): Admission: RE | Admit: 2014-08-15 | Payer: BC Managed Care – PPO | Source: Ambulatory Visit

## 2014-09-04 ENCOUNTER — Telehealth: Payer: Self-pay | Admitting: Family Medicine

## 2014-09-04 DIAGNOSIS — R222 Localized swelling, mass and lump, trunk: Secondary | ICD-10-CM

## 2014-09-04 DIAGNOSIS — R0781 Pleurodynia: Secondary | ICD-10-CM

## 2014-09-04 NOTE — Telephone Encounter (Signed)
Pt called to let you know her rib is still sore and you can see inflammation.  There is another knot on floating rib now. She wanted to know what to do next

## 2014-09-05 NOTE — Telephone Encounter (Signed)
Patient called back. Notified her of Dr Gutierrez's comments. Patient verbalized understanding.

## 2014-09-05 NOTE — Telephone Encounter (Signed)
plz let pt know - I will touch base with our sports medicine doctor and contact her later in the week for plan.

## 2014-09-05 NOTE — Telephone Encounter (Signed)
Left voicemail for patient to call back. 

## 2014-09-11 NOTE — Telephone Encounter (Signed)
I recommend getting imaging study done - would suggest start with ultrasound (cheaper, no radiation) for further evaluation, but if unrevealing may need CT scan of chest wall. I will order if pt willing.

## 2014-09-11 NOTE — Telephone Encounter (Signed)
Patient notified and she is willing to get [place]. Will await call to schedule.

## 2014-09-12 NOTE — Addendum Note (Signed)
Addended by: Ria Bush on: 09/12/2014 07:53 AM   Modules accepted: Orders

## 2014-09-12 NOTE — Telephone Encounter (Signed)
Ordered

## 2014-09-14 ENCOUNTER — Ambulatory Visit
Admission: RE | Admit: 2014-09-14 | Discharge: 2014-09-14 | Disposition: A | Discharge status: Home / Self Care | Payer: BC Managed Care – PPO | Source: Ambulatory Visit | Attending: Family Medicine | Admitting: Family Medicine

## 2014-09-14 DIAGNOSIS — R0781 Pleurodynia: Secondary | ICD-10-CM

## 2014-09-14 DIAGNOSIS — R222 Localized swelling, mass and lump, trunk: Secondary | ICD-10-CM

## 2014-10-16 IMAGING — US US BREAST*L* LIMITED INC AXILLA
1 series · 3 of 3 positions shown · non-contrast
Comparison: MULTIPLE PRIOR EXAMS.

CLINICAL DATA: Call back from screening for a possible mass on the
left.

EXAM:
DIGITAL DIAGNOSTIC  LEFT MAMMOGRAM
ULTRASOUND LEFT BREAST

[Series 1: us breast*left* limited inc axilla · 3 of 3 slices shown]
[im 1/3]
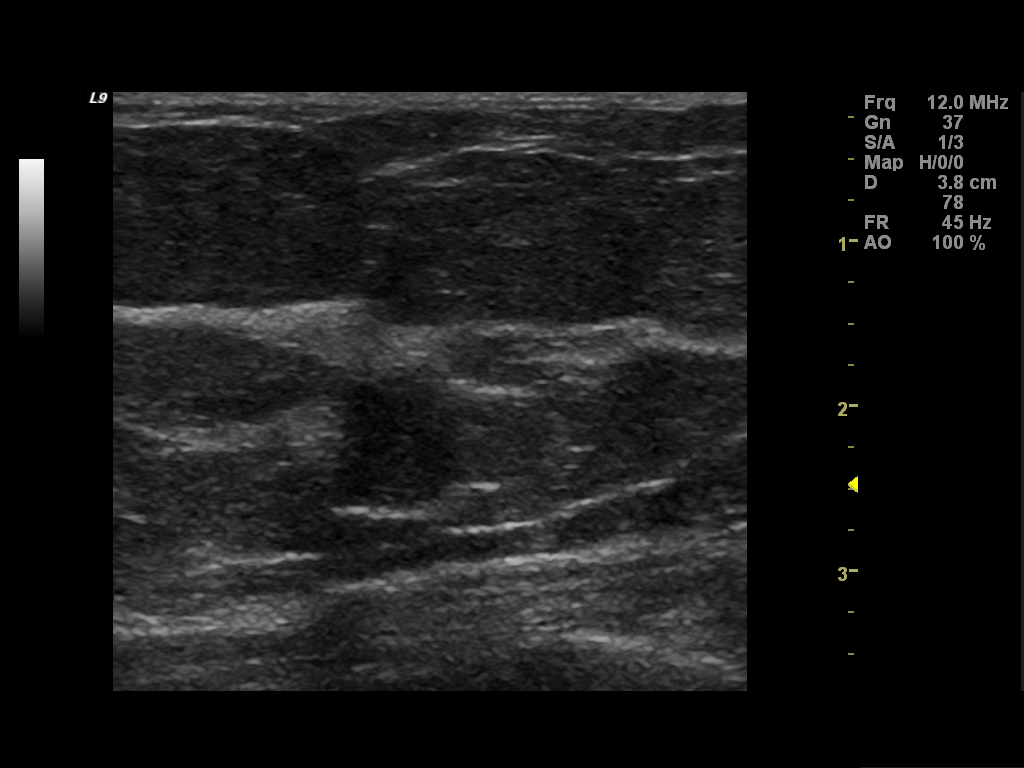
[im 2/3]
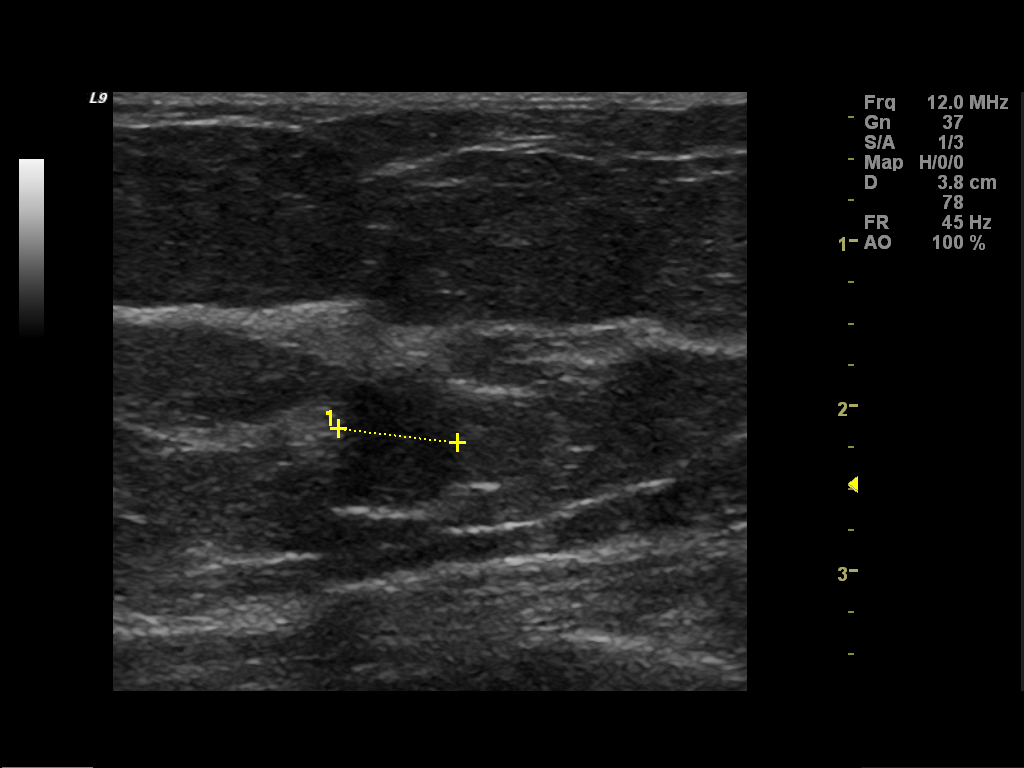
[im 3/3]
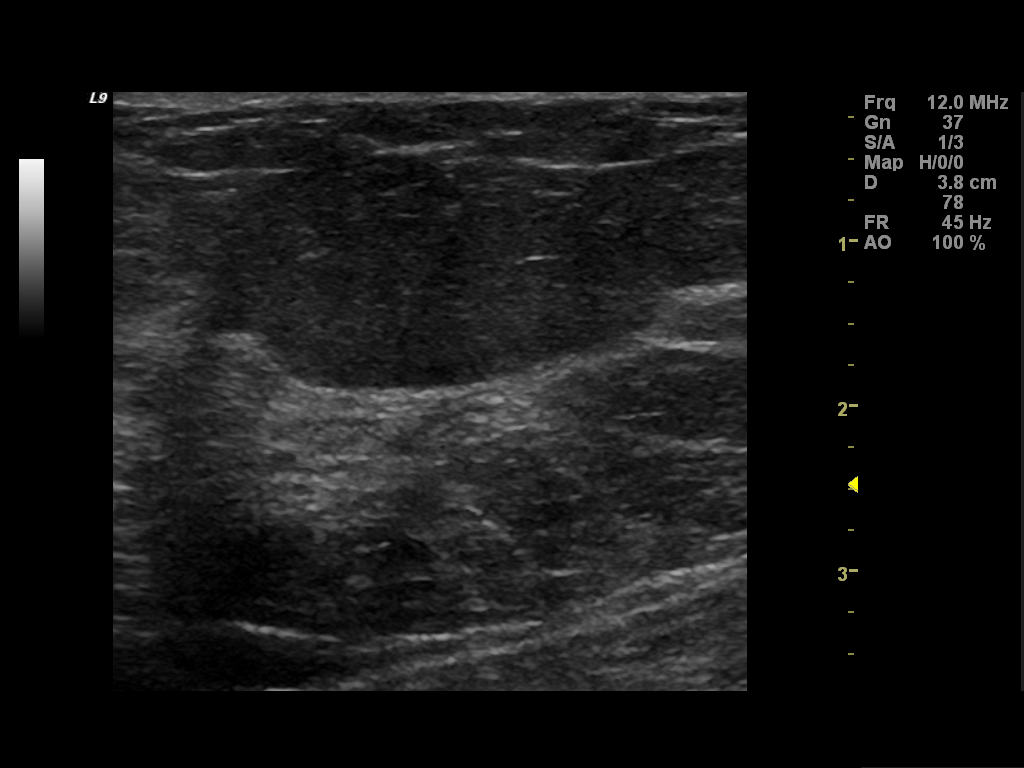

[3 of 3 positions shown; findings below may reference images not displayed]

ACR Breast Density Category c: The breast tissue is heterogeneously
dense, which may obscure small masses.
FINDINGS: Spot compression imaging, there is no definitive mass, no
architectural distortion and no suspicious calcifications.

On physical exam, no mass is palpated.

Ultrasound is performed, showing a vague focal area in the 1 o'clock
position of the left breast 10 cm from the nipple only evident on
the antiradial view. This merges with normal appearing
fibroglandular tissue on the radial view, and is most likely normal
breast tissue. Exam otherwise unremarkable.
IMPRESSION: Probably benign than the left.

RECOMMENDATION:
Short-term follow-up recommended. Repeat left breast mammography and
possible ultrasound 6 months.

I have discussed the findings and recommendations with the patient.
Results were also provided in writing at the conclusion of the
visit. If applicable, a reminder letter will be sent to the patient
regarding the next appointment.

BI-RADS CATEGORY  3: Probably benign finding(s) - short interval
follow-up suggested.

## 2014-11-02 ENCOUNTER — Encounter: Payer: Self-pay | Admitting: Family Medicine

## 2014-11-02 ENCOUNTER — Ambulatory Visit (INDEPENDENT_AMBULATORY_CARE_PROVIDER_SITE_OTHER): Payer: BC Managed Care – PPO | Admitting: Family Medicine

## 2014-11-02 VITALS — BP 124/76 | HR 72 | Temp 98.2°F | Wt 153.0 lb

## 2014-11-02 DIAGNOSIS — J020 Streptococcal pharyngitis: Secondary | ICD-10-CM

## 2014-11-02 DIAGNOSIS — J029 Acute pharyngitis, unspecified: Secondary | ICD-10-CM

## 2014-11-02 LAB — POCT RAPID STREP A (OFFICE): Rapid Strep A Screen: POSITIVE — AB

## 2014-11-02 MED ORDER — AMOXICILLIN 875 MG PO TABS: 875.0000 mg | ORAL_TABLET | Freq: Two times a day (BID) | ORAL | Status: DC

## 2014-11-02 NOTE — Patient Instructions (Signed)
Strep plus either allergies or viral infection Treat with amoxicillin 10d course. Push fluids and rest Ibuprofen or tylenol for throat inflammation. Honey with lemon. Let us know if not improving as expected or fever >101 or worsening sore throat or trouble opening the mouth

## 2014-11-02 NOTE — Addendum Note (Signed)
Addended by: Ria Bush on: 11/02/2014 03:03 PM   Modules accepted: Miquel Dunn

## 2014-11-02 NOTE — Progress Notes (Addendum)
BP 124/76 mmHg  Pulse 72  Temp(Src) 98.2 F (36.8 C) (Oral)  Wt 153 lb (69.4 kg)  LMP 10/25/2014   CC: sore throat, fever  Subjective:    Patient ID: Brandi Cherry, female    DOB: June 10, 1966, 49 y.o.   MRN: [id number]  HPI: Brandi Cherry is a 49 y.o. female presenting on 11/02/2014 for Sore Throat   Recent basal cell CA on apex of scalp, to see derm next week for re excision. Thinking about changing dermatologists from Dr [name] to Dr Delman Cheadle  Not feeling well over last several days with ST, yesterday woke up "like truck hit me", ST, sinus congestion, body aches, headache, fever/chills last night, minimal cough. + earaches bilaterally. Tmax 100 yesterday.   Multiple sick contacts with strep. Husband with ST as well.  No h/o asthma. No smokers at home.  Relevant past medical, surgical, family and social history reviewed and updated as indicated. Interim medical history since our last visit reviewed. Allergies and medications reviewed and updated. Current Outpatient Prescriptions on File Prior to Visit  Medication Sig  . ibuprofen (ADVIL,MOTRIN) 200 MG tablet Take 400 mg by mouth every 6 (six) hours as needed.  . Multiple Vitamin (MULTIVITAMIN) tablet Take 1 tablet by mouth daily.  . valACYclovir (VALTREX) 500 MG tablet Take 500 mg by mouth daily as needed.   . diclofenac sodium (VOLTAREN) 1 % GEL Apply 1 application topically 3 (three) times daily. (Patient not taking: Reported on 11/02/2014)  . naproxen (NAPROSYN) 500 MG tablet Take one po bid x 1 week then prn pain, take with food (Patient not taking: Reported on 11/02/2014)   No current facility-administered medications on file prior to visit.    Review of Systems Per HPI unless specifically indicated above     Objective:    BP 124/76 mmHg  Pulse 72  Temp(Src) 98.2 F (36.8 C) (Oral)  Wt 153 lb (69.4 kg)  LMP 10/25/2014  Wt Readings from Last 3 Encounters:  11/02/14 153 lb (69.4 kg)  07/27/14 157 lb 8 oz (71.442 kg)    07/17/14 155 lb 8 oz (70.534 kg)    Physical Exam  Constitutional: She appears well-developed and well-nourished. No distress.  HENT:  Head: Normocephalic and atraumatic.  Right Ear: Hearing, tympanic membrane, external ear and ear canal normal.  Left Ear: Hearing, tympanic membrane, external ear and ear canal normal.  Nose: Mucosal edema present. No rhinorrhea. Right sinus exhibits no maxillary sinus tenderness and no frontal sinus tenderness. Left sinus exhibits no maxillary sinus tenderness and no frontal sinus tenderness.  Mouth/Throat: Uvula is midline and mucous membranes are normal. Posterior oropharyngeal erythema present. No oropharyngeal exudate, posterior oropharyngeal edema or tonsillar abscesses.  Eyes: Conjunctivae and EOM are normal. Pupils are equal, round, and reactive to light. No scleral icterus.  Neck: Normal range of motion. Neck supple.  Cardiovascular: Normal rate, regular rhythm, normal heart sounds and intact distal pulses.   No murmur heard. Pulmonary/Chest: Effort normal and breath sounds normal. No respiratory distress. She has no wheezes. She has no rales.  Lymphadenopathy:    She has cervical adenopathy (R PC LAD).  Skin: Skin is warm and dry. No rash noted.  Nursing note and vitals reviewed.  Results for orders placed or performed in visit on 11/02/14  POCT rapid strep A  Result Value Ref Range   Rapid Strep A Screen Positive (A) Negative      Assessment & Plan:   Problem List Items Addressed This  Visit    Acute streptococcal pharyngitis - Primary    Positive strep test today Treat with amoxicillin course Anticipate concommitant allergic rhinitis flare vs viral process given associated congestion and body aches Update if not improving with treatment.       Other Visit Diagnoses    Sore throat        Relevant Orders    POCT rapid strep A (Completed)        Follow up plan: Return if symptoms worsen or fail to improve.

## 2014-11-02 NOTE — Progress Notes (Signed)
Pre visit review using our clinic review tool, if applicable. No additional management support is needed unless otherwise documented below in the visit note. 

## 2014-11-02 NOTE — Assessment & Plan Note (Signed)
Positive strep test today Treat with amoxicillin course Anticipate concommitant allergic rhinitis flare vs viral process given associated congestion and body aches Update if not improving with treatment.

## 2014-11-06 ENCOUNTER — Telehealth: Payer: Self-pay | Admitting: *Deleted

## 2014-11-06 MED ORDER — GUAIFENESIN-CODEINE 100-10 MG/5ML PO SYRP: 5.0000 mL | ORAL_SOLUTION | Freq: Every evening | ORAL | Status: DC | PRN

## 2014-11-06 NOTE — Telephone Encounter (Signed)
Recommend plain mucinex 600mg  twice daily with large glass of water - this is over the counter. We can also prescribe codeine cough syrup for night time if pt desires - printed script and in Kim's box.

## 2014-11-06 NOTE — Telephone Encounter (Signed)
I still think most of her symptoms are viral - the amox is treatment for her strep throat and this may be what is causing the dry mouth. Ok to write letter for work if needed. Does she think she needs expectorant (mucous stuck in head and chest) or cough suppressant?

## 2014-11-06 NOTE — Telephone Encounter (Signed)
Message left advising patient. Instructed to call if she wants Rx.

## 2014-11-06 NOTE — Telephone Encounter (Signed)
Called and said she isn't feeling any better. She stayed out of work again today. She now has a dry cough. She says her lips, her mouth and nose all feel extremely dry-like she hasn't had anything to drink in her life, however she is drinking plenty of water. No temp (she checked while on the phone with me). She still has body aches. She wants to know if the abx are enough to cover whatever else is going on with her or if you think it's still viral. She is willing to let it run it's course, but feels like she needs some sort of expectorant. She also wonders if the amoxicillin is what is causing her to feel so dried out.

## 2014-11-06 NOTE — Telephone Encounter (Signed)
She says she needs expectorant. She says if she could get the congestion out, she thinks she would feel better. However, with everything being so dried up, she can't get it out.

## 2014-12-18 ENCOUNTER — Telehealth: Payer: Self-pay | Admitting: Family Medicine

## 2014-12-18 DIAGNOSIS — R0781 Pleurodynia: Secondary | ICD-10-CM

## 2014-12-18 DIAGNOSIS — M19049 Primary osteoarthritis, unspecified hand: Secondary | ICD-10-CM

## 2014-12-18 NOTE — Addendum Note (Signed)
Addended by: Ria Bush on: 12/18/2014 05:00 PM   Modules accepted: Orders

## 2014-12-18 NOTE — Telephone Encounter (Signed)
Referral placed. plz also send chest [place] an hand xray.

## 2014-12-18 NOTE — Telephone Encounter (Signed)
Patient was told to call back if she'd like to go to a rheumatologist for inflammation on rib cage,fingers,frozen shoulder.  Patient would like to be referred to [facility].  If she can't get in with Dr.Beekman she'll go to anyone at that practice.  Patient can go anytime.

## 2014-12-20 NOTE — Telephone Encounter (Signed)
12/20/14-spoke to pt, informed her Dr. Amil Amen was leaving practice and they have not told us where he is moving to. I gave her the option of Dr. Estanislado Pandy or Dr. Charlestine Night. She agreed with Dr. Estanislado Pandy. Referral faxed 12/20/14 and pt is aware once records are reviewed and they accept her as pt, their office will call directly to schedule.  I also informed pt if she is denied at Dr. Estanislado Pandy we will try Dr. Elmon Else office. If that doesn't work out we will have to try [facility] or [facility].

## 2014-12-29 ENCOUNTER — Ambulatory Visit (INDEPENDENT_AMBULATORY_CARE_PROVIDER_SITE_OTHER): Payer: BC Managed Care – PPO | Admitting: Internal Medicine

## 2014-12-29 ENCOUNTER — Encounter: Payer: Self-pay | Admitting: Internal Medicine

## 2014-12-29 VITALS — BP 106/72 | HR 110 | Temp 98.1°F | Ht 66.0 in | Wt 154.0 lb

## 2014-12-29 DIAGNOSIS — R682 Dry mouth, unspecified: Secondary | ICD-10-CM

## 2014-12-29 DIAGNOSIS — J029 Acute pharyngitis, unspecified: Secondary | ICD-10-CM

## 2014-12-29 LAB — POCT RAPID STREP A (OFFICE): Rapid Strep A Screen: NEGATIVE

## 2014-12-29 MED ORDER — AZITHROMYCIN 250 MG PO TABS: ORAL_TABLET | ORAL | Status: DC

## 2014-12-29 NOTE — Patient Instructions (Signed)
Please take all new medication as prescribed - the antibiotic  Please continue all other medications as before, and refills have been done if requested.  Please have the pharmacy call with any other refills you may need.  Please keep your appointments with your specialists as you may have planned   

## 2014-12-29 NOTE — Assessment & Plan Note (Signed)
Mild to mod, for antibx course,  to f/u any worsening symptoms or concerns 

## 2014-12-29 NOTE — Assessment & Plan Note (Signed)
Etiology unclear, for hard candies prn, f/u rheum as planned

## 2014-12-29 NOTE — Addendum Note (Signed)
Addended by: Lyman Bishop on: 12/29/2014 12:08 PM   Modules accepted: Orders

## 2014-12-29 NOTE — Progress Notes (Signed)
Pre visit review using our clinic review tool, if applicable. No additional management support is needed unless otherwise documented below in the visit note. 

## 2014-12-29 NOTE — Progress Notes (Signed)
Subjective:    Patient ID: Brandi Cherry, female    DOB: Apr 09, 1966, 49 y.o.   MRN: [id number]  HPI    Here with c/o 3 days onset severe worsening ST with feeling warm, not sure about fever, but no chills.  Has hx recurrent strep, and child with same and rheumatic fever complication.  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  Pt denies chest pain, increased sob or doe, wheezing, orthopnea, PND, increased LE swelling, palpitations, dizziness or syncope.  Pt denies new neurological symptoms such as new headache, or facial or extremity weakness or numbness   Also has ongoing dry mouth for several wks worsening, has appt with rheum but not until aug 2016/. Past Medical History  Diagnosis Date  . Hx of migraines     with cycle  . Urine incontinence   . Vertigo   . 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 History of UTI     tends to get with relations  . Melanoma in situ of face     gets yearly skin checks [name])  . History of miscarriage     G5P2  . Adhesive capsulitis of shoulder 2014    s/p GH steroid injection  . 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 Teena Dunk at [facility])  . Osteoarthritis 2015    IPs of hands   Past Surgical History  Procedure Laterality Date  . Hospitalization  2002    urosepsis, again with son  . Cardiac electrophysiology study and ablation  04/2013    AVNRT (EP Dr. Teena Dunk at [facility])  . Dilation and curettage of uterus  05/2013    uterine polyp with menorrhagia    reports that she has never smoked. She has never used smokeless tobacco. She reports that she does not drink alcohol or use illicit drugs. family history includes Arthritis in her other; Cancer in her maternal uncle; Coronary artery disease (age of onset: 56) in her father; Hypertension in her father; Mental retardation in her maternal grandmother; Thyroid disease (age  of onset: 8) in her mother. There is no history of Diabetes. No Known Allergies Current Outpatient Prescriptions on File Prior to Visit  Medication Sig Dispense Refill  . Multiple Vitamin (MULTIVITAMIN) tablet Take 1 tablet by mouth daily.    . valACYclovir (VALTREX) 500 MG tablet Take 500 mg by mouth daily as needed.     Marland Kitchen amoxicillin (AMOXIL) 875 MG tablet Take 1 tablet (875 mg total) by mouth 2 (two) times daily. (Patient not taking: Reported on 12/29/2014) 20 tablet 0  . diclofenac sodium (VOLTAREN) 1 % GEL Apply 1 application topically 3 (three) times daily. (Patient not taking: Reported on 11/02/2014) 1 Tube 1  . guaiFENesin-codeine (ROBITUSSIN AC) 100-10 MG/5ML syrup Take 5 mLs by mouth at bedtime as needed. (Patient not taking: Reported on 12/29/2014) 160 mL 0  . ibuprofen (ADVIL,MOTRIN) 200 MG tablet Take 400 mg by mouth every 6 (six) hours as needed.    . naproxen (NAPROSYN) 500 MG tablet Take one po bid x 1 week then prn pain, take with food (Patient not taking: Reported on 11/02/2014) 40 tablet 0   No current facility-administered medications on file prior to visit.   Review of Systems   All otherwise neg per pt    Objective:   Physical  Exam BP 106/72 mmHg  Pulse 110  Temp(Src) 98.1 F (36.7 C) (Oral)  Ht 5\' 6"  (1.676 m)  Wt 154 lb (69.854 kg)  BMI 24.87 kg/m2  SpO2 99%  LMP 12/27/2014 VS noted, mild ill appearing Constitutional: Pt appears in no significant distress HENT: Head: NCAT.  Right Ear: External ear normal.  Left Ear: External ear normal.  Bilat tm's with mild erythema.  Max sinus areas mild tender.  Pharynx with mod erythema, + exudate, mouth somewhat dry appearing Eyes: . Pupils are equal, round, and reactive to light. Conjunctivae and EOM are normal Neck: Normal range of motion. Neck supple. with bilat submandib tender LA Cardiovascular: Normal rate and regular rhythm.   Pulmonary/Chest: Effort normal and breath sounds without rales or wheezing.  Abd:  Soft,  NT, + BS Neurological: Pt is alert. Not confused , motor grossly intact Skin: Skin is warm. No rash, no LE edema Psychiatric: Pt behavior is normal. No agitation.     Assessment & Plan:

## 2015-01-09 ENCOUNTER — Ambulatory Visit (INDEPENDENT_AMBULATORY_CARE_PROVIDER_SITE_OTHER): Payer: BC Managed Care – PPO | Admitting: Family Medicine

## 2015-01-09 ENCOUNTER — Encounter: Payer: Self-pay | Admitting: Family Medicine

## 2015-01-09 VITALS — BP 107/60 | HR 107 | Temp 98.3°F | Ht 66.0 in | Wt 155.5 lb

## 2015-01-09 DIAGNOSIS — R682 Dry mouth, unspecified: Secondary | ICD-10-CM

## 2015-01-09 DIAGNOSIS — R0982 Postnasal drip: Secondary | ICD-10-CM | POA: Diagnosis not present

## 2015-01-09 DIAGNOSIS — J351 Hypertrophy of tonsils: Secondary | ICD-10-CM | POA: Diagnosis not present

## 2015-01-09 LAB — POCT RAPID STREP A (OFFICE): Rapid Strep A Screen: NEGATIVE

## 2015-01-09 NOTE — Progress Notes (Signed)
Pre visit review using our clinic review tool, if applicable. No additional management support is needed unless otherwise documented below in the visit note. 

## 2015-01-09 NOTE — Patient Instructions (Addendum)
Likely resolving viral infection and allergies causing postnasal drip.  Start nasal saline spray 2-3 times a day. Gargle water daily to wash out tonsil crypts. Can try flonase 2 sprays per nostril daily to decrease post nasal drip Start mucinex daily to break up mucus. Can try humidifier at home. Follow up with PCP, Dr. Darnell Level, if not improving as expected in next 2 weeks.

## 2015-01-09 NOTE — Progress Notes (Signed)
Subjective:    Patient ID: Brandi Cherry, female    DOB: Apr 18, 1966, 49 y.o.   MRN: [id number]  HPI  49 year old female present for follow up of sore throat. She has had continuous symptoms since end of June.   She had positive strep test 2 months ago, was treated with  amoxicillin, resolved completely  She was seen by Dr. Jenny Reichmann on 7/1. Neg rapid strep test. Treated with zpack. She noted no change, but worsened dry mouth, nasal congestion started.   This improved but now sore throat is better, but still  Raw in morning and tightness feeling in back of throat, trouble swallowing food. Feels post nasal drip.  Dx with fibromyalgia 1 week ago at rheum. Recommending healthy lifestyle , no new meds.  She is worried dry mouth may be due to this, sjogren's etc. Rheum stated they would consider once her acute illness resolved if it continued.  Works at [employer].    Review of Systems  Constitutional: Negative for fever and fatigue.  HENT: Positive for postnasal drip and trouble swallowing. Negative for ear pain, sinus pressure and voice change.   Eyes: Negative for pain.  Respiratory: Negative for cough and shortness of breath.   Cardiovascular: Negative for chest pain, palpitations and leg swelling.  Gastrointestinal: Negative for abdominal pain.       Objective:   Physical Exam  Constitutional: Vital signs are normal. She appears well-developed and well-nourished. She is cooperative.  Non-toxic appearance. She does not appear ill. No distress.  HENT:  Head: Normocephalic.  Right Ear: Hearing, tympanic membrane, external ear and ear canal normal. Tympanic membrane is not erythematous, not retracted and not bulging.  Left Ear: Hearing, tympanic membrane, external ear and ear canal normal. Tympanic membrane is not erythematous, not retracted and not bulging.  Nose: No mucosal edema or rhinorrhea. Right sinus exhibits no maxillary sinus tenderness and no frontal sinus tenderness.  Left sinus exhibits no maxillary sinus tenderness and no frontal sinus tenderness.  Mouth/Throat: Uvula is midline, oropharynx is clear and moist and mucous membranes are normal. No oropharyngeal exudate, posterior oropharyngeal edema, posterior oropharyngeal erythema or tonsillar abscesses.  Bilateral tonsils enlarged mildly, no erythema, large crypts.  Eyes: Conjunctivae, EOM and lids are normal. Pupils are equal, round, and reactive to light. Lids are everted and swept, no foreign bodies found.  Neck: Trachea normal and normal range of motion. Neck supple. Carotid bruit is not present. No thyroid mass and no thyromegaly present.  Cardiovascular: Normal rate, regular rhythm, S1 normal, S2 normal, normal heart sounds, intact distal pulses and normal pulses.  Exam reveals no gallop and no friction rub.   No murmur heard. Pulmonary/Chest: Effort normal and breath sounds normal. No tachypnea. No respiratory distress. She has no decreased breath sounds. She has no wheezes. She has no rhonchi. She has no rales.  Abdominal: Soft. Normal appearance and bowel sounds are normal. There is no tenderness.  Lymphadenopathy:       Head (right side): No submental, no submandibular, no preauricular, no posterior auricular and no occipital adenopathy present.       Head (left side): No submental, no submandibular, no preauricular, no posterior auricular and no occipital adenopathy present.    She has no cervical adenopathy.  Neurological: She is alert.  Skin: Skin is warm, dry and intact. No rash noted.  Psychiatric: Her speech is normal and behavior is normal. Judgment and thought content normal. Her mood appears not anxious. Cognition and  memory are normal. She does not exhibit a depressed mood.          Assessment & Plan:  Pt likely with resolving viral URI, some residual enlarged tonsils, large crypts. Repeat strep test negative. Likely symptoms also due to post nasal drip and allergies. Treat with  mucinex, flonase, gargling and nasal saline rinse. No clear indication for further antibiotics.

## 2015-11-09 ENCOUNTER — Encounter: Payer: Self-pay | Admitting: Family Medicine

## 2015-11-09 ENCOUNTER — Ambulatory Visit (INDEPENDENT_AMBULATORY_CARE_PROVIDER_SITE_OTHER): Payer: BC Managed Care – PPO | Admitting: Family Medicine

## 2015-11-09 VITALS — BP 110/73 | HR 88 | Temp 98.3°F | Ht 66.0 in | Wt 154.0 lb

## 2015-11-09 DIAGNOSIS — R21 Rash and other nonspecific skin eruption: Secondary | ICD-10-CM | POA: Diagnosis not present

## 2015-11-09 MED ORDER — CEPHALEXIN 500 MG PO CAPS: 500.0000 mg | ORAL_CAPSULE | Freq: Three times a day (TID) | ORAL | Status: AC

## 2015-11-09 NOTE — Progress Notes (Signed)
   Subjective:    Patient ID: Brandi Cherry, female    DOB: 1965-08-20, 50 y.o.   MRN: [id number]  HPI Here for the onset this am of a red rash on the left cheek. It burns and itches to some degree, in fact she says it feels like a sunburn. Otherwise she feels fine. She had an actinic keratosis frozen off by her dermatologist on this cheek about 7 weeks ago. Otherwise no hx of trauma. No recent changes in medications or makeup.    Review of Systems  Constitutional: Negative.   HENT: Negative.   Eyes: Negative.   Respiratory: Negative.   Cardiovascular: Negative.   Skin: Positive for rash.  Neurological: Negative.        Objective:   Physical Exam  Constitutional: She is oriented to person, place, and time. She appears well-developed and well-nourished.  HENT:  Right Ear: External ear normal.  Left Ear: External ear normal.  Nose: Nose normal.  Mouth/Throat: Oropharynx is clear and moist.  Eyes: Conjunctivae are normal.  Neck: No thyromegaly present.  Pulmonary/Chest: Effort normal and breath sounds normal.  Lymphadenopathy:    She has no cervical adenopathy.  Neurological: She is alert and oriented to person, place, and time.  Skin:  The left cheek has an area of warmth and erythema, no swelling. No signs of trauma          Assessment & Plan:  Rash, possible cellulitis. Cover with Keflex. If the rash spreads to both cheeks, she will see her PCP next week to consider a work up for lupus, etc.  Laurey Morale, MD

## 2015-11-09 NOTE — Progress Notes (Signed)
Pre visit review using our clinic review tool, if applicable. No additional management support is needed unless otherwise documented below in the visit note. 

## 2016-06-29 ENCOUNTER — Emergency Department (HOSPITAL_COMMUNITY): Payer: BC Managed Care – PPO

## 2016-06-29 ENCOUNTER — Encounter (HOSPITAL_COMMUNITY): Payer: Self-pay | Admitting: Emergency Medicine

## 2016-06-29 ENCOUNTER — Emergency Department (HOSPITAL_COMMUNITY)
Admission: EM | Admit: 2016-06-29 | Discharge: 2016-06-29 | Disposition: A | Discharge status: Home / Self Care | Payer: BC Managed Care – PPO | Attending: Emergency Medicine | Admitting: Emergency Medicine

## 2016-06-29 DIAGNOSIS — Z79899 Other long term (current) drug therapy: Secondary | ICD-10-CM | POA: Insufficient documentation

## 2016-06-29 DIAGNOSIS — R42 Dizziness and giddiness: Secondary | ICD-10-CM | POA: Diagnosis not present

## 2016-06-29 DIAGNOSIS — Z85820 Personal history of malignant melanoma of skin: Secondary | ICD-10-CM | POA: Insufficient documentation

## 2016-06-29 DIAGNOSIS — H6121 Impacted cerumen, right ear: Secondary | ICD-10-CM | POA: Diagnosis not present

## 2016-06-29 DIAGNOSIS — R202 Paresthesia of skin: Secondary | ICD-10-CM | POA: Diagnosis not present

## 2016-06-29 LAB — COMPREHENSIVE METABOLIC PANEL WITH GFR
ALT: 18 U/L (ref 14–54)
AST: 26 U/L (ref 15–41)
Albumin: 4.3 g/dL (ref 3.5–5.0)
Alkaline Phosphatase: 64 U/L (ref 38–126)
Anion gap: 8 (ref 5–15)
BUN: 10 mg/dL (ref 6–20)
CO2: 25 mmol/L (ref 22–32)
Calcium: 9 mg/dL (ref 8.9–10.3)
Chloride: 104 mmol/L (ref 101–111)
Creatinine, Ser: 0.46 mg/dL (ref 0.44–1.00)
GFR calc Af Amer: >60 mL/min
GFR calc non Af Amer: >60 mL/min
Glucose, Bld: 102 mg/dL — ABNORMAL HIGH (ref 65–99)
Potassium: 4.3 mmol/L (ref 3.5–5.1)
Sodium: 137 mmol/L (ref 135–145)
Total Bilirubin: 0.8 mg/dL (ref 0.3–1.2)
Total Protein: 7.7 g/dL (ref 6.5–8.1)

## 2016-06-29 LAB — URINALYSIS, ROUTINE W REFLEX MICROSCOPIC
Bilirubin Urine: NEGATIVE
Glucose, UA: NEGATIVE mg/dL
Hgb urine dipstick: NEGATIVE
Ketones, ur: 20 mg/dL — AB
Leukocytes, UA: NEGATIVE
Nitrite: NEGATIVE
Protein, ur: NEGATIVE mg/dL
Specific Gravity, Urine: 1.014 (ref 1.005–1.030)
pH: 9 — ABNORMAL HIGH (ref 5.0–8.0)

## 2016-06-29 LAB — CBC WITH DIFFERENTIAL/PLATELET
BASOS ABS: 0 10*3/uL (ref 0.0–0.1)
Basophils Relative: 0 %
Eosinophils Absolute: 0 10*3/uL (ref 0.0–0.7)
Eosinophils Relative: 0 %
HEMATOCRIT: 38.7 % (ref 36.0–46.0)
HEMOGLOBIN: 12.9 g/dL (ref 12.0–15.0)
LYMPHS PCT: 6 %
Lymphs Abs: 0.5 10*3/uL — ABNORMAL LOW (ref 0.7–4.0)
MCH: 26.2 pg (ref 26.0–34.0)
MCHC: 33.3 g/dL (ref 30.0–36.0)
MCV: 78.5 fL (ref 78.0–100.0)
MONO ABS: 0.3 10*3/uL (ref 0.1–1.0)
MONOS PCT: 4 %
NEUTROS ABS: 8.5 10*3/uL — AB (ref 1.7–7.7)
NEUTROS PCT: 90 %
Platelets: 194 10*3/uL (ref 150–400)
RBC: 4.93 MIL/uL (ref 3.87–5.11)
RDW: 13.6 % (ref 11.5–15.5)
WBC: 9.4 10*3/uL (ref 4.0–10.5)

## 2016-06-29 LAB — PREGNANCY, URINE: Preg Test, Ur: NEGATIVE

## 2016-06-29 LAB — RAPID URINE DRUG SCREEN, HOSP PERFORMED
AMPHETAMINES: NOT DETECTED
BENZODIAZEPINES: NOT DETECTED
Barbiturates: NOT DETECTED
COCAINE: NOT DETECTED
OPIATES: NOT DETECTED
TETRAHYDROCANNABINOL: NOT DETECTED

## 2016-06-29 LAB — I-STAT CG4 LACTIC ACID, ED: Lactic Acid, Venous: 1.01 mmol/L (ref 0.5–1.9)

## 2016-06-29 LAB — LIPASE, BLOOD: Lipase: 19 U/L (ref 11–51)

## 2016-06-29 MED ORDER — SODIUM CHLORIDE 0.9 % IV SOLN
1000.0000 mL | Freq: Once | INTRAVENOUS | Status: AC
  Administered 2016-06-29: 1000 mL via INTRAVENOUS

## 2016-06-29 MED ORDER — ONDANSETRON HCL 4 MG/2ML IJ SOLN
4.0000 mg | Freq: Once | INTRAMUSCULAR | Status: AC
  Administered 2016-06-29: 4 mg via INTRAVENOUS
  Filled 2016-06-29: qty 2

## 2016-06-29 MED ORDER — MECLIZINE HCL 25 MG PO TABS
25.0000 mg | ORAL_TABLET | Freq: Once | ORAL | Status: AC
  Administered 2016-06-29: 25 mg via ORAL
  Filled 2016-06-29: qty 1

## 2016-06-29 MED ORDER — MECLIZINE HCL 25 MG PO TABS: 25.0000 mg | ORAL_TABLET | Freq: Three times a day (TID) | ORAL | 0 refills | Status: DC | PRN

## 2016-06-29 MED ORDER — ONDANSETRON 4 MG PO TBDP: 4.0000 mg | ORAL_TABLET | ORAL | 0 refills | Status: DC | PRN

## 2016-06-29 MED ORDER — SODIUM CHLORIDE 0.9 % IV SOLN
1000.0000 mL | INTRAVENOUS | Status: DC
  Administered 2016-06-29: 1000 mL via INTRAVENOUS

## 2016-06-29 NOTE — ED Triage Notes (Addendum)
Pt c/o nausea since 10:30 am this morning, pt also c/o tingling in fingers bilateral. Denies v/d. Pt c/o vertigo while being triaged.

## 2016-06-29 NOTE — ED Notes (Signed)
Pt advised of the wait to see a provider.

## 2016-06-29 NOTE — ED Provider Notes (Signed)
[place] DEPT Provider Note   CSN: [id number] Arrival date & time: 06/29/16  1332     History   Chief Complaint Chief Complaint  Patient presents with  . Nausea  . Tingling    in fingertips    HPI Brandi Cherry is a 50 y.o. female.  HPI Patient reports that she had felt well when she awakened this morning she had been up and then she was back in bed talking with her husband and she quite suddenly started to feel an odd movement sensation and nausea. She reports that she's had vertigo once before and what was different about this was that the first time she had vertigo she was very symptomatic in the lying flat position needed to stays seated upright and this time she had to stay flat. She reports that her symptoms started she got an intense urgency to have a bowel movement and had to get up to have a bowel movement. She reports it made her feel very nauseated as well. She did go on to have a bowel movement. She then tried to rest in a chair for a while but the symptoms persisted. She reports it feels like things are moving side to side but not exactly spinning. She reports that she got tearful and anxious because she felt so terrible. She feels very nauseated. No headache and no visual change. No focal weakness or numbness but patient reports she started to get tingling in her hands and her hands made this odd cramping. She reports while that was happening EMS was telling her to slow down her breathing. She reports she's concerned as well because during her pregnancy sometime ago she got a prolonged hospitalization due to sepsis which was difficult to diagnose. Now she reports she feels like she is having chills and nauseated and is also concerned for possibly developing sepsis symptoms. She has not had fever. Past Medical History:  Diagnosis Date  . Adhesive capsulitis of shoulder 2014   s/p GH steroid injection  . 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 Teena Dunk at [facility])  . History of miscarriage    G5P2  . History of UTI    tends to get with relations  . 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 Hx of migraines    with cycle  . Melanoma in situ of face [facility])    gets yearly skin checks [name])  . 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 Urine incontinence   . Vertigo     Patient Active Problem List   Diagnosis Date Noted  . Post-nasal drip 01/09/2015  . Enlarged tonsils 01/09/2015  . Acute pharyngitis 12/29/2014  . Dry mouth 12/29/2014  . Acute streptococcal pharyngitis 11/02/2014  . Rib pain 07/18/2014  . Heel pain, bilateral 02/23/2014  . 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 AVNRT (AV nodal re-entry tachycardia) ([place]) 03/17/2013  . 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 Lower back pain 11/11/2011  . Palpitation   . Hx of migraines   . HSV-2 (herpes simplex virus 2) infection   . Melanoma in situ of face ([place])   . History of UTI     Past Surgical History:  Procedure Laterality Date  . CARDIAC ELECTROPHYSIOLOGY STUDY AND ABLATION  04/2013   AVNRT (EP Dr. Teena Dunk at [facility])  . DILATION AND CURETTAGE OF UTERUS  05/2013   uterine polyp  with menorrhagia  . hospitalization  2002   urosepsis, again with son    OB History    No data available       Home Medications    Prior to Admission medications   Medication Sig Start Date End Date Taking? Authorizing Provider  ibuprofen (ADVIL,MOTRIN) 200 MG tablet Take 400 mg by mouth every 6 (six) hours as needed.   Yes Historical Provider, MD  valACYclovir (VALTREX) 500 MG tablet Take 500 mg by mouth daily as needed.    Yes Historical Provider, MD  meclizine (ANTIVERT) 25 MG tablet Take 1 tablet (25 mg total) by mouth 3 (three) times daily as needed for dizziness. 06/29/16   Charlesetta Shanks, MD  ondansetron (ZOFRAN ODT) 4 MG disintegrating tablet Take 1 tablet (4 mg total) by mouth every 4 (four) hours as needed for nausea or vomiting.  06/29/16   Charlesetta Shanks, MD    Family History Family History  Problem Relation Age of Onset  . Thyroid disease Mother 31  . Coronary artery disease Father 33    MI  . Hypertension Father   . Cancer Maternal Uncle     unsure  . Mental retardation Maternal Grandmother     bipolar  . Arthritis Other     strong on mother's side, no RA though  . Diabetes Neg Hx     Social History Social History  Substance Use Topics  . Smoking status: Never Smoker  . Smokeless tobacco: Never Used  . Alcohol use No     Allergies   Patient has no known allergies.   Review of Systems Review of Systems 10 Systems reviewed and are negative for acute change except as noted in the HPI.   Physical Exam Updated Vital Signs BP 122/81   Pulse 80   Temp 97.9 F (36.6 C) (Oral)   Resp 15   LMP 06/01/2016   SpO2 100%   Physical Exam  Constitutional: She is oriented to person, place, and time. She appears well-developed and well-nourished. No distress.  HENT:  Head: Normocephalic and atraumatic.  Right Ear: External ear normal.  Left Ear: External ear normal.  Nose: Nose normal.  Mouth/Throat: Oropharynx is clear and moist.  Moderate cerumen in right canal but TM is visible and not erythematous. Left TM is scarred but not erythematous or bulging. Dentition good condition.  Eyes: Conjunctivae and EOM are normal. Pupils are equal, round, and reactive to light.  Neck: Neck supple. No thyromegaly present.  Cardiovascular: Normal rate and regular rhythm.   No murmur heard. Pulmonary/Chest: Effort normal and breath sounds normal. No respiratory distress.  Abdominal: Soft. There is no tenderness. There is no guarding.  Musculoskeletal: She exhibits no edema or tenderness.  Lymphadenopathy:    She has no cervical adenopathy.  Neurological: She is alert and oriented to person, place, and time. No cranial nerve deficit. She exhibits normal muscle tone. Coordination normal.  Skin: Skin is warm  and dry.  Psychiatric: She has a normal mood and affect.  Nursing note and vitals reviewed.    ED Treatments / Results  Labs (all labs ordered are listed, but only abnormal results are displayed) Labs Reviewed  COMPREHENSIVE METABOLIC PANEL - Abnormal; Notable for the following:       Result Value   Glucose, Bld 102 (*)    All other components within normal limits  CBC WITH DIFFERENTIAL/PLATELET - Abnormal; Notable for the following:    Neutro Abs 8.5 (*)    Lymphs Abs  0.5 (*)    All other components within normal limits  URINALYSIS, ROUTINE W REFLEX MICROSCOPIC - Abnormal; Notable for the following:    APPearance CLOUDY (*)    pH 9.0 (*)    Ketones, ur 20 (*)    All other components within normal limits  CULTURE, BLOOD (ROUTINE X 2)  CULTURE, BLOOD (ROUTINE X 2)  LIPASE, BLOOD  PREGNANCY, URINE  RAPID URINE DRUG SCREEN, HOSP PERFORMED  I-STAT CG4 LACTIC ACID, ED    EKG  EKG Interpretation None       Radiology Dg Chest 2 View  Result Date: 06/29/2016 CLINICAL DATA:  Patient with intermittent chills for multiple weeks. EXAM: CHEST  2 VIEW COMPARISON:  Chest radiograph 07/17/2014. FINDINGS: Monitoring leads overlie the patient. Stable cardiac and mediastinal contours. No consolidative pulmonary opacities. No pleural effusion or pneumothorax. Mid thoracic spine degenerative changes. IMPRESSION: No active cardiopulmonary disease. Electronically Signed   By: Lovey Newcomer M.D.   On: 06/29/2016 16:19   Ct Head Wo Contrast  Result Date: 06/29/2016 CLINICAL DATA:  Nausea, tingling in fingers bilaterally.  Vertigo. EXAM: CT HEAD WITHOUT CONTRAST TECHNIQUE: Contiguous axial images were obtained from the base of the skull through the vertex without intravenous contrast. COMPARISON:  None. FINDINGS: Brain: No acute intracranial abnormality. Specifically, no hemorrhage, hydrocephalus, mass lesion, acute infarction, or significant intracranial injury. Vascular: No hyperdense vessel or  unexpected calcification. Skull: No acute calvarial abnormality. Sinuses/Orbits: Visualized paranasal sinuses and mastoids clear. Orbital soft tissues unremarkable. Other: None IMPRESSION: No intracranial abnormality. Electronically Signed   By: Rolm Baptise M.D.   On: 06/29/2016 16:10    Procedures Procedures (including critical care time)  Medications Ordered in ED Medications  0.9 %  sodium chloride infusion (0 mLs Intravenous Stopped 06/29/16 1749)    Followed by  0.9 %  sodium chloride infusion (1,000 mLs Intravenous New Bag/Given 06/29/16 1749)  meclizine (ANTIVERT) tablet 25 mg (25 mg Oral Given 06/29/16 1543)  ondansetron (ZOFRAN) injection 4 mg (4 mg Intravenous Given 06/29/16 1549)  meclizine (ANTIVERT) tablet 25 mg (25 mg Oral Given 06/29/16 1940)     Initial Impression / Assessment and Plan / ED Course  I have reviewed the triage vital signs and the nursing notes.  Pertinent labs & imaging results that were available during my care of the patient were reviewed by me and considered in my medical decision making (see chart for details).  Clinical Course     19:10 patient feels much better. Still mild dizziness but nausea much improved.  Final Clinical Impressions(s) / ED Diagnoses   Final diagnoses:  Vertigo  Paresthesia  Patient responded very well to fluids Zofran and fluids and meclizine. Patient had prior history of vertigo. Symptoms had many qualities of vertigo and no associated headache. She describes carpal spasm and paresthesia of the hands. Patient was not hyperventilating at the time of my evaluation but she did advise me that EMS was telling her to slow her breathing down and the symptoms were occurring. I have low suspicion that this represents TIA\CVA type symptoms. Patient had concern for sepsis due to history of reportedly delayed diagnosis of sepsis. This time, I have no reason to think there is infectious etiology. Symptoms came on very acutely and  diagnostic workup is otherwise within normal limits. At this time patient is a good condition. Signs and symptoms for which to return are reviewed  New Prescriptions New Prescriptions   MECLIZINE (ANTIVERT) 25 MG TABLET    Take 1  tablet (25 mg total) by mouth 3 (three) times daily as needed for dizziness.   ONDANSETRON (ZOFRAN ODT) 4 MG DISINTEGRATING TABLET    Take 1 tablet (4 mg total) by mouth every 4 (four) hours as needed for nausea or vomiting.     Charlesetta Shanks, MD 06/29/16 [phone]

## 2016-06-29 NOTE — ED Notes (Signed)
PT DISCHARGED. INSTRUCTIONS AND PRESCRIPTIONS GIVEN. AAOX4. PT IN NO APPARENT DISTRESS OR PAIN. THE OPPORTUNITY TO ASK QUESTIONS WAS PROVIDED. 

## 2016-06-29 NOTE — ED Notes (Signed)
Bed: WA08 Expected date:  Expected time:  Means of arrival:  Comments: EMS 

## 2016-07-01 LAB — BLOOD CULTURE ID PANEL (REFLEXED)
Acinetobacter baumannii: NOT DETECTED
CANDIDA KRUSEI: NOT DETECTED
Candida albicans: NOT DETECTED
Candida glabrata: NOT DETECTED
Candida parapsilosis: NOT DETECTED
Candida tropicalis: NOT DETECTED
ENTEROBACTER CLOACAE COMPLEX: NOT DETECTED
ENTEROCOCCUS SPECIES: NOT DETECTED
ESCHERICHIA COLI: NOT DETECTED
Enterobacteriaceae species: NOT DETECTED
Haemophilus influenzae: NOT DETECTED
Klebsiella oxytoca: NOT DETECTED
Klebsiella pneumoniae: NOT DETECTED
LISTERIA MONOCYTOGENES: NOT DETECTED
Neisseria meningitidis: NOT DETECTED
PSEUDOMONAS AERUGINOSA: NOT DETECTED
Proteus species: NOT DETECTED
SERRATIA MARCESCENS: NOT DETECTED
STAPHYLOCOCCUS AUREUS BCID: NOT DETECTED
STREPTOCOCCUS PNEUMONIAE: NOT DETECTED
STREPTOCOCCUS PYOGENES: NOT DETECTED
STREPTOCOCCUS SPECIES: NOT DETECTED
Staphylococcus species: NOT DETECTED
Streptococcus agalactiae: NOT DETECTED

## 2016-07-04 LAB — CULTURE, BLOOD (ROUTINE X 2): Culture: NO GROWTH

## 2016-07-05 ENCOUNTER — Telehealth: Payer: Self-pay

## 2016-07-05 NOTE — Telephone Encounter (Signed)
No treatment for BC, Likely conatninant per Racjel Rumbarger Pharm D and Arlean Hopping [facility]

## 2016-07-07 ENCOUNTER — Ambulatory Visit (INDEPENDENT_AMBULATORY_CARE_PROVIDER_SITE_OTHER): Payer: BC Managed Care – PPO | Admitting: Family Medicine

## 2016-07-07 ENCOUNTER — Encounter: Payer: Self-pay | Admitting: Family Medicine

## 2016-07-07 VITALS — BP 110/90 | HR 102 | Ht 66.0 in | Wt 156.0 lb

## 2016-07-07 DIAGNOSIS — H8112 Benign paroxysmal vertigo, left ear: Secondary | ICD-10-CM | POA: Insufficient documentation

## 2016-07-07 NOTE — Assessment & Plan Note (Signed)
ER records reviewed. Discussed dx with patient. Provided with home exercises for canalith repositioning. Discussed meclizine use. Update if not improving with treatment for vestibular rehab referral. Pt agrees with plan.

## 2016-07-07 NOTE — Progress Notes (Signed)
BP 110/90   Pulse (!) 102   Ht 5\' 6"  (1.676 m)   Wt 156 lb (70.8 kg)   LMP 06/01/2016   SpO2 97%   BMI 25.18 kg/m    CC: ER f/u visit Subjective:    Patient ID: Brandi Cherry, female    DOB: 03/29/1966, 51 y.o.   MRN: [id number]  HPI: Brandi Cherry is a 51 y.o. female presenting on 07/07/2016 for Hospitalization Follow-up (Was at the ER for vertigo on New Year's eve went back home on Monday. ) and Dizziness (feels like is coming back too Meclizine at 9:30am. )   Seen at ER 06/29/2016 with vertigo, dx with benign peripheral vertigo cause, CT head WNL, CXR WNL. Labs unrevealing. Felt better after meclizine, zofran, IVF so discharged home.   Fingers and toes felt cold. Associated with nausea and diarrhea, no vomiting.  No preceding viral illness. No hearing changes, no tinnitus.   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 overall significantly improved. Over weekend again had some mild vertigo to rapid head movements. Tried to return to work today - could not tolerate this. Trouble tolerating meclizine.   Prior h/o vertigo that woke her up from sleep 4 yrs ago associated with nausea/vomiting and lasted 3 hours.   Relevant past medical, surgical, family and social history reviewed and updated as indicated. Interim medical history since our last visit reviewed. Allergies and medications reviewed and updated. Current Outpatient Prescriptions on File Prior to Visit  Medication Sig  . ibuprofen (ADVIL,MOTRIN) 200 MG tablet Take 400 mg by mouth every 6 (six) hours as needed.  . meclizine (ANTIVERT) 25 MG tablet Take 1 tablet (25 mg total) by mouth 3 (three) times daily as needed for dizziness.  . ondansetron (ZOFRAN ODT) 4 MG disintegrating tablet Take 1 tablet (4 mg total) by mouth every 4 (four) hours as needed for nausea or vomiting.  . valACYclovir (VALTREX) 500 MG tablet Take 500 mg by mouth daily as needed.    No current facility-administered  medications on file prior to visit.     Review of Systems Per HPI unless specifically indicated in ROS section     Objective:    BP 110/90   Pulse (!) 102   Ht 5\' 6"  (1.676 m)   Wt 156 lb (70.8 kg)   LMP 06/01/2016   SpO2 97%   BMI 25.18 kg/m   Wt Readings from Last 3 Encounters:  07/07/16 156 lb (70.8 kg)  11/09/15 154 lb (69.9 kg)  01/09/15 155 lb 8 oz (70.5 kg)    Physical Exam  Constitutional: She is oriented to person, place, and time. She appears well-developed and well-nourished. No distress.  HENT:  Head: Normocephalic and atraumatic.  Mouth/Throat: Oropharynx is clear and moist. No oropharyngeal exudate.  Eyes: Conjunctivae and EOM are normal. Pupils are equal, round, and reactive to light. No scleral icterus.  Neck: Normal range of motion. Neck supple.  Cardiovascular: Normal rate, regular rhythm, normal heart sounds and intact distal pulses.   No murmur heard. Pulmonary/Chest: Effort normal and breath sounds normal. No respiratory distress. She has no wheezes. She has no rales.  Musculoskeletal: She exhibits no edema.  Neurological: She is alert and oriented to person, place, and time. She has normal strength. No cranial nerve deficit or sensory deficit.  CN 2-12 intact FTN intact EOMI Station and gait intact dix hallpike + on left epley canalith repositioning maneuvers performed on left x1.  Skin: Skin is warm and dry. No rash noted.  Psychiatric: She has a normal mood and affect.  Nursing note and vitals reviewed.  Results for orders placed or performed during the hospital encounter of 06/29/16  Culture, blood (routine x 2)  Result Value Ref Range   Specimen Description BLOOD LEFT ARM    Special Requests BOTTLES DRAWN AEROBIC AND ANAEROBIC 5CC    Culture      NO GROWTH 5 DAYS Performed at [facility]    Report Status 07/04/2016 FINAL   Culture, blood (routine x 2)  Result Value Ref Range   Specimen Description BLOOD RIGHT ANTECUBITAL     Special Requests BOTTLES DRAWN AEROBIC AND ANAEROBIC 10 CC EACH    Culture  Setup Time      GRAM VARIABLE ROD AEROBIC BOTTLE ONLY CRITICAL RESULT CALLED TO, READ BACK BY AND VERIFIED WITH: L ADKINS 07/01/16 @ 62 M VESTAL    Culture (A)     BACILLUS SPECIES Standardized susceptibility testing for this organism is not available. Performed at [facility]    Report Status 07/04/2016 FINAL   Blood Culture ID Panel (Reflexed)  Result Value Ref Range   Enterococcus species NOT DETECTED NOT DETECTED   Listeria monocytogenes NOT DETECTED NOT DETECTED   Staphylococcus species NOT DETECTED NOT DETECTED   Staphylococcus aureus NOT DETECTED NOT DETECTED   Streptococcus species NOT DETECTED NOT DETECTED   Streptococcus agalactiae NOT DETECTED NOT DETECTED   Streptococcus pneumoniae NOT DETECTED NOT DETECTED   Streptococcus pyogenes NOT DETECTED NOT DETECTED   Acinetobacter baumannii NOT DETECTED NOT DETECTED   Enterobacteriaceae species NOT DETECTED NOT DETECTED   Enterobacter cloacae complex NOT DETECTED NOT DETECTED   Escherichia coli NOT DETECTED NOT DETECTED   Klebsiella oxytoca NOT DETECTED NOT DETECTED   Klebsiella pneumoniae NOT DETECTED NOT DETECTED   Proteus species NOT DETECTED NOT DETECTED   Serratia marcescens NOT DETECTED NOT DETECTED   Haemophilus influenzae NOT DETECTED NOT DETECTED   Neisseria meningitidis NOT DETECTED NOT DETECTED   Pseudomonas aeruginosa NOT DETECTED NOT DETECTED   Candida albicans NOT DETECTED NOT DETECTED   Candida glabrata NOT DETECTED NOT DETECTED   Candida krusei NOT DETECTED NOT DETECTED   Candida parapsilosis NOT DETECTED NOT DETECTED   Candida tropicalis NOT DETECTED NOT DETECTED  Comprehensive metabolic panel  Result Value Ref Range   Sodium 137 135 - 145 mmol/L   Potassium 4.3 3.5 - 5.1 mmol/L   Chloride 104 101 - 111 mmol/L   CO2 25 22 - 32 mmol/L   Glucose, Bld 102 (H) 65 - 99 mg/dL   BUN 10 6 - 20 mg/dL   Creatinine, Ser 0.46  0.44 - 1.00 mg/dL   Calcium 9.0 8.9 - 10.3 mg/dL   Total Protein 7.7 6.5 - 8.1 g/dL   Albumin 4.3 3.5 - 5.0 g/dL   AST 26 15 - 41 U/L   ALT 18 14 - 54 U/L   Alkaline Phosphatase 64 38 - 126 U/L   Total Bilirubin 0.8 0.3 - 1.2 mg/dL   GFR calc non Af Amer >60 >60 mL/min   GFR calc Af Amer >60 >60 mL/min   Anion gap 8 5 - 15  Lipase, blood  Result Value Ref Range   Lipase 19 11 - 51 U/L  CBC with Differential  Result Value Ref Range   WBC 9.4 4.0 - 10.5 K/uL   RBC 4.93 3.87 - 5.11 MIL/uL   Hemoglobin 12.9 12.0 - 15.0 g/dL  HCT 38.7 36.0 - 46.0 %   MCV 78.5 78.0 - 100.0 fL   MCH 26.2 26.0 - 34.0 pg   MCHC 33.3 30.0 - 36.0 g/dL   RDW 13.6 11.5 - 15.5 %   Platelets 194 150 - 400 K/uL   Neutrophils Relative % 90 %   Neutro Abs 8.5 (H) 1.7 - 7.7 K/uL   Lymphocytes Relative 6 %   Lymphs Abs 0.5 (L) 0.7 - 4.0 K/uL   Monocytes Relative 4 %   Monocytes Absolute 0.3 0.1 - 1.0 K/uL   Eosinophils Relative 0 %   Eosinophils Absolute 0.0 0.0 - 0.7 K/uL   Basophils Relative 0 %   Basophils Absolute 0.0 0.0 - 0.1 K/uL  Urinalysis, Routine w reflex microscopic  Result Value Ref Range   Color, Urine YELLOW YELLOW   APPearance CLOUDY (A) CLEAR   Specific Gravity, Urine 1.014 1.005 - 1.030   pH 9.0 (H) 5.0 - 8.0   Glucose, UA NEGATIVE NEGATIVE mg/dL   Hgb urine dipstick NEGATIVE NEGATIVE   Bilirubin Urine NEGATIVE NEGATIVE   Ketones, ur 20 (A) NEGATIVE mg/dL   Protein, ur NEGATIVE NEGATIVE mg/dL   Nitrite NEGATIVE NEGATIVE   Leukocytes, UA NEGATIVE NEGATIVE  Pregnancy, urine  Result Value Ref Range   Preg Test, Ur NEGATIVE NEGATIVE  Urine rapid drug screen (hosp performed)  Result Value Ref Range   Opiates NONE DETECTED NONE DETECTED   Cocaine NONE DETECTED NONE DETECTED   Benzodiazepines NONE DETECTED NONE DETECTED   Amphetamines NONE DETECTED NONE DETECTED   Tetrahydrocannabinol NONE DETECTED NONE DETECTED   Barbiturates NONE DETECTED NONE DETECTED  I-Stat CG4 Lactic Acid, ED    Result Value Ref Range   Lactic Acid, Venous 1.01 0.5 - 1.9 mmol/L      Assessment & Plan:  Over 25 minutes were spent face-to-face with the patient during this encounter and >50% of that time was spent on counseling and coordination of care  Problem List Items Addressed This Visit    Benign paroxysmal positional vertigo of left ear - Primary    ER records reviewed. Discussed dx with patient. Provided with home exercises for canalith repositioning. Discussed meclizine use. Update if not improving with treatment for vestibular rehab referral. Pt agrees with plan.          Follow up plan: Return if symptoms worsen or fail to improve.  Ria Bush, MD

## 2016-07-07 NOTE — Patient Instructions (Addendum)
You have benign positional vertigo.  Do repositioning maneuvers provided today as tolerated.  May use meclizine as needed.  If no better, let us know for vestibular rehab (physical therapy).   Benign Positional Vertigo Introduction Vertigo is the feeling that you or your surroundings are moving when they are not. Benign positional vertigo is the most common form of vertigo. The cause of this condition is not serious (is benign). This condition is triggered by certain movements and positions (is positional). This condition can be dangerous if it occurs while you are doing something that could endanger you or others, such as driving. What are the causes? In many cases, the cause of this condition is not known. It may be caused by a disturbance in an area of the inner ear that helps your brain to sense movement and balance. This disturbance can be caused by a viral infection (labyrinthitis), head injury, or repetitive motion. What increases the risk? This condition is more likely to develop in:  Women.  People who are 50 years of age or older. What are the signs or symptoms? Symptoms of this condition usually happen when you move your head or your eyes in different directions. Symptoms may start suddenly, and they usually last for less than a minute. Symptoms may include:  Loss of balance and falling.  Feeling like you are spinning or moving.  Feeling like your surroundings are spinning or moving.  Nausea and vomiting.  Blurred vision.  Dizziness.  Involuntary eye movement (nystagmus). Symptoms can be mild and cause only slight annoyance, or they can be severe and interfere with daily life. Episodes of benign positional vertigo may return (recur) over time, and they may be triggered by certain movements. Symptoms may improve over time. How is this diagnosed? This condition is usually diagnosed by medical history and a physical exam of the head, neck, and ears. You may be referred to a  health care provider who specializes in ear, nose, and throat (ENT) problems (otolaryngologist) or a provider who specializes in disorders of the nervous system (neurologist). You may have additional testing, including:  MRI.  A CT scan.  Eye movement tests. Your health care provider may ask you to change positions quickly while he or she watches you for symptoms of benign positional vertigo, such as nystagmus. Eye movement may be tested with an electronystagmogram (ENG), caloric stimulation, the Dix-Hallpike test, or the roll test.  An electroencephalogram (EEG). This records electrical activity in your brain.  Hearing tests. How is this treated? Usually, your health care provider will treat this by moving your head in specific positions to adjust your inner ear back to normal. Surgery may be needed in severe cases, but this is rare. In some cases, benign positional vertigo may resolve on its own in 2-4 weeks. Follow these instructions at home: Safety  Move slowly.Avoid sudden body or head movements.  Avoid driving.  Avoid operating heavy machinery.  Avoid doing any tasks that would be dangerous to you or others if a vertigo episode would occur.  If you have trouble walking or keeping your balance, try using a cane for stability. If you feel dizzy or unstable, sit down right away.  Return to your normal activities as told by your health care provider. Ask your health care provider what activities are safe for you. General instructions  Take over-the-counter and prescription medicines only as told by your health care provider.  Avoid certain positions or movements as told by your health care provider.  Drink enough fluid to keep your urine clear or pale yellow.  Keep all follow-up visits as told by your health care provider. This is important. Contact a health care provider if:  You have a fever.  Your condition gets worse or you develop new symptoms.  Your family or friends  notice any behavioral changes.  Your nausea or vomiting gets worse.  You have numbness or a "pins and needles" sensation. Get help right away if:  You have difficulty speaking or moving.  You are always dizzy.  You faint.  You develop severe headaches.  You have weakness in your legs or arms.  You have changes in your hearing or vision.  You develop a stiff neck.  You develop sensitivity to light. This information is not intended to replace advice given to you by your health care provider. Make sure you discuss any questions you have with your health care provider. Document Released: 03/24/2006 Document Revised: 11/22/2015 Document Reviewed: 10/09/2014  2017 [employer]

## 2016-07-09 ENCOUNTER — Ambulatory Visit: Payer: BC Managed Care – PPO | Admitting: Family Medicine

## 2016-07-10 ENCOUNTER — Telehealth: Payer: Self-pay | Admitting: Family Medicine

## 2016-07-10 DIAGNOSIS — H9319 Tinnitus, unspecified ear: Secondary | ICD-10-CM

## 2016-07-10 DIAGNOSIS — H8112 Benign paroxysmal vertigo, left ear: Secondary | ICD-10-CM

## 2016-07-10 NOTE — Telephone Encounter (Signed)
Pt called and she is still having some spatial issues visually w/ balance concerns and would like to be referred to vestibular rehab/ PT.  Patient would like first available in [place] and call her with date and time.

## 2016-07-10 NOTE — Telephone Encounter (Signed)
Pt called - she is having ringing in her ears starting Monday night.  She also states that she has wax build up, which she heard could cause her vertigo.   She is wanting to know if she needs to go to ent before going to PT.  She is still not going back to work.   She is doing the positional exersices at home and it is helping some, but still having symptoms. She doesn't feel spatially grounded when her body is moving.   cb number is [phone]. Pt prefers [place]

## 2016-07-10 NOTE — Telephone Encounter (Signed)
referral placed

## 2016-07-10 NOTE — Telephone Encounter (Signed)
With new symptom, reasonable to get ENT evaluation to r/p meniere's. Referral placed.  plz send this phone note as well.

## 2016-07-11 ENCOUNTER — Ambulatory Visit: Payer: BC Managed Care – PPO | Admitting: Family Medicine

## 2016-07-11 ENCOUNTER — Ambulatory Visit: Payer: BC Managed Care – PPO | Attending: Family Medicine | Admitting: Rehabilitative and Restorative Service Providers"

## 2016-07-11 DIAGNOSIS — R42 Dizziness and giddiness: Secondary | ICD-10-CM

## 2016-07-11 DIAGNOSIS — R2689 Other abnormalities of gait and mobility: Secondary | ICD-10-CM | POA: Insufficient documentation

## 2016-07-11 NOTE — Therapy (Signed)
[facility] [address] [place], [place], [address] Phone: [phone]   Fax:  [phone]  Physical Therapy Evaluation  Patient Details  Name: Brandi Cherry MRN: [id number] Date of Birth: 03/03/1966 Referring Provider: Murray Hodgkins, MD  Encounter Date: 07/11/2016      PT End of Session - 07/11/16 1018    Visit Number 1   Number of Visits 6   Date for PT Re-Evaluation 08/25/16   Authorization Type Private insurance- check for authorization   PT Start Time [phone]   PT Stop Time 0936   PT Time Calculation (min) 46 min   Activity Tolerance Patient tolerated treatment well   Behavior During Therapy [facility] for tasks assessed/performed      Past Medical History:  Diagnosis Date  . Adhesive capsulitis of shoulder 2014   s/p GH steroid injection  . AVNRT (AV nodal re-entry tachycardia) ([place]) 04/2013   s/p ablation (Dr. Teena Dunk at [facility])  . History of miscarriage    G5P2  . History of UTI    tends to get with relations  . 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 Hx of migraines    with cycle  . Melanoma in situ of face [facility])    gets yearly skin checks [name])  . 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 Urine incontinence   . Vertigo     Past Surgical History:  Procedure Laterality Date  . CARDIAC ELECTROPHYSIOLOGY STUDY AND ABLATION  04/2013   AVNRT (EP Dr. Teena [facility]unk at [facility])  . DILATION AND CURETTAGE OF UTERUS  05/2013   uterine polyp with menorrhagia  . hospitalization  2002   urosepsis, again with son    There were no vitals filed for this visit.       Subjective Assessment - 07/11/16 0935    Subjective The patient had onset of vertigo in 2013 with waking in the middle of the night with sensation of room spinning , nausea/vomitting, imbalance (she had positional sensitivity as well)--this resolved within 3 days.  The patient has a recent return  of symptoms when getting up from bed on 06/29/16 with room spinning vertigo that stopped when she returned to supine.  She had nausea with diarrhea, sensation of rocking sensation like on a boat, developed cold sensation, chills.   She called ambulance and was transported to the hospital.  She notes symptoms reduced after taking anti-vert and anti-nausea medicine.  She had fullwork-up at hospital and was d/c home.  She tried to work on Monday and could not tolerate the visual stimulation.  She also noted tinnitus that began on Monday, no tinnitus Wed/Thursday.   In her home environment, she can function well, however is limiting all head motion and is not lying flat in the bed.  "I feel like spatially, I don't have control over what's going on."   Pertinent History h/o migraines (atleast 4 days/month during hormonal changes, light sensitivity headaches), motion sickness, h/o cardiac ablation due to AV tachycardia.   Patient Stated Goals "get back to normal functioning."   Currently in Pain? No/denies            [facility] PT Assessment - 07/11/16 0940      Assessment   Medical Diagnosis BPPV of the left ear   Referring Provider Murray Hodgkins, MD   Onset Date/Surgical Date 06/29/16   Prior Therapy none  Precautions   Precautions --  moving en bloc due to dizziness     Restrictions   Weight Bearing Restrictions No     Balance Screen   Has the patient fallen in the past 6 months No   Has the patient had a decrease in activity level because of a fear of falling?  No  activity level diminished due to vertigo   Is the patient reluctant to leave their home because of a fear of falling?  No  unable to work     [employer] residence   Living Arrangements Spouse/significant other     Prior Function   Level of Independence Independent   Vocation Full time employment  school counselor   Vocation Requirements works in busy environment      Observation/Other Assessments   Focus on Therapeutic Outcomes (FOTO)  68%   Other Surveys  Other Surveys   Dizziness Handicap Inventory [facility])  64%            Vestibular Assessment - 07/11/16 0950      Vestibular Assessment   General Observation Walks into clinic without device independently moving en bloc     Symptom Behavior   Type of Dizziness Imbalance  difficulty turning head, unable to tolerate head motion   Frequency of Dizziness daily   Duration of Dizziness constant in busy environment   Aggravating Factors Activity in general  moving in busy environments   Relieving Factors Head stationary;Avoiding busy/distracting areas     Occulomotor Exam   Occulomotor Alignment Normal   Spontaneous Absent   Gaze-induced Absent   Smooth Pursuits Intact   Saccades Intact   Comment Moves between glasses and no glasses during functional mobility at baseline.       Vestibulo-Occular Reflex   VOR 1 Head Only (x 1 viewing) slow gaze x 1 provokes sensation of visual movement to the right side.   Comment head impulse test=positive to the right side for mild refixation saccade     Visual Acuity   Static line 5 static with both eyes open   Dynamic line 3 with dynamic visual acuity at 2Hz  speed     Positional Testing   Dix-Hallpike Dix-Hallpike Right;Dix-Hallpike Left   Sidelying Test Sidelying Right;Sidelying Left   Horizontal Canal Testing Horizontal Canal Right;Horizontal Canal Left     Sidelying Right   Sidelying Right Duration No nausea or dizziness   Sidelying Right Symptoms No nystagmus     Sidelying Left   Sidelying Left Duration mild sensation of nausea x seconds worse with return to sitting   Sidelying Left Symptoms No nystagmus     Horizontal Canal Right   Horizontal Canal Right Duration no   Horizontal Canal Right Symptoms Normal     Horizontal Canal Left   Horizontal Canal Left Duration no   Horizontal Canal Left Symptoms Normal                 Vestibular Treatment/Exercise - 07/11/16 1010      Vestibular Treatment/Exercise   Vestibular Treatment Provided Habituation;Gaze   Habituation Exercises Nestor Lewandowsky   Gaze Exercises X1 Viewing Horizontal;X1 Viewing Vertical     Nestor Lewandowsky   Number of Reps  1   Symptom Description  provided handout with instruction for habituation     X1 Viewing Horizontal   Foot Position standing feet apart   Comments x 30 seconds with no glasses, 10 reps with glasses donned with cues on technique  X1 Viewing Vertical   Foot Position standing feet apart   Comments x 30 seconds without glasses and 10 reps with glasses.               PT Education - 07/11/16 1018    Education provided Yes   Education Details HEP: VOR x 1 viewing, habituation   Person(s) Educated Patient   Methods Explanation;Demonstration;Handout   Comprehension Verbalized understanding;Returned demonstration          PT Short Term Goals - 07/11/16 1133      PT SHORT TERM GOAL #1   Title STGs=LTGs           PT Long Term Goals - 07/11/16 1009      PT LONG TERM GOAL #1   Title The patient will return demo HEP for motion sensitivity, gaze adaptation and general functional mobility.   Baseline Target date 08/11/2016   Time 4   Period Weeks     PT LONG TERM GOAL #2   Title The patient will improve DHI from 64% to < or equal to 48% to demo dec'd self perception of vertigo   Baseline Target date 08/11/2016   Time 4   Period Weeks     PT LONG TERM GOAL #3   Title The patient will tolerate gaze x 1 adaptation to tolerate x 60 seconds nonstop without c/o visual blurring ( at self regulated pace).   Baseline Target date 08/11/2016   Time 4   Period Weeks     PT LONG TERM GOAL #4   Title The patient will tolerate sit<>bilateral sidelying without c/o dizziness or nausea (notes 2-3/10 symptoms)   Baseline Target date 08/11/2016   Time 4   Period Weeks     PT LONG TERM GOAL #5   Title The patient will  be able to return to work verbalizing strategies to reduce symtpoms in busy environments for improved tolerance to movement/visual stimulation.   Baseline Target date 08/11/2016   Time 4   Period Weeks               Plan - 07/11/16 1138    Clinical Impression Statement The patient is a 51 year old female with h/o brief duration vertigo in 2014 (described history could indicate possible neuritis vs bppv?) that resolved within 3 days.  She had a recurrence of vertigo 06/29/16 related to onset of L BPPV per MD referral.  At this time, BPPV appears resolved per positional testing, however she continues with motion sensitivity and avoidance of head motion + diminished VOR/gaze creating visual blurring with head motion.  Her h/o migraines, functioning with lenses on and lenses off, and her avoidance of head motion may all be contributing to a delayed vestibular adaptation.  PT emphasizing head motion, gaze adaptation (lenses on and off) to promote improved functional mobility and return to prior life roles/activiites.   Rehab Potential Good   PT Frequency 1x / week   PT Duration 6 weeks   PT Treatment/Interventions ADLs/Self Care Home Management;Vestibular;Canalith Repostioning;Therapeutic activities;Therapeutic exercise;Neuromuscular re-education;Patient/family education;Functional mobility training;Gait training   PT Next Visit Plan Check gaze adaptation, progress motion sensitivity/habituation home program, walking program, reintegration into busy environments.   Consulted and Agree with Plan of Care Patient      Patient will benefit from skilled therapeutic intervention in order to improve the following deficits and impairments:  Abnormal gait, Dizziness, Impaired vision/preception, Decreased activity tolerance  Visit Diagnosis: Dizziness and giddiness  Other abnormalities of gait and  mobility     Problem List Patient Active Problem List   Diagnosis Date Noted  . Benign paroxysmal  positional vertigo of left ear 07/07/2016  . Post-nasal drip 01/09/2015  . Enlarged tonsils 01/09/2015  . Acute pharyngitis 12/29/2014  . Dry mouth 12/29/2014  . Acute streptococcal pharyngitis 11/02/2014  . Rib pain 07/18/2014  . Heel pain, bilateral 02/23/2014  . 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 AVNRT (AV nodal re-entry tachycardia) ([place]) 03/17/2013  . 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 Lower back pain 11/11/2011  . Palpitation   . Hx of migraines   . HSV-2 (herpes simplex virus 2) infection   . Melanoma in situ of face ([place])   . History of UTI     Trace Cederberg, PT 07/11/2016, 11:51 AM  [facility] [address] [place], [place], [address] Phone: [phone]   Fax:  [phone]  Name: TOMEKI MANGANO MRN: [id number] Date of Birth: 22-Jul-1965

## 2016-07-11 NOTE — Patient Instructions (Signed)
Gaze Stabilization - Tip Card  1.Target must remain in focus, not blurry, and appear stationary while head is in motion. 2.Perform exercises with small head movements (45 to either side of midline). 3.Increase speed of head motion so long as target is in focus. 4.If you wear eyeglasses, be sure you can see target through lens (therapist will give specific instructions for bifocal / progressive lenses). 5.These exercises may provoke dizziness or nausea. Work through these symptoms. If too dizzy, slow head movement slightly. Rest between each exercise. 6.Exercises demand concentration; avoid distractions. 7.For safety, perform standing exercises close to a counter, wall, corner, or next to someone.  Copyright  VHI. All rights reserved.   Gaze Stabilization - Standing Feet Apart   Feet shoulder width apart, keeping eyes on target on wall 3 feet away, tilt head down slightly and move head side to side for 30 seconds. Repeat while moving head up and down for 30 seconds. Do 2-3 sessions per day.  WITH GLASSES: Stand 5-8 ft from target wearing glasses Do 10 repetitions of side to side, let symptoms settle and then repeat up and down 10 times.    Do 2-3 times/day. Copyright  VHI. All rights reserved.   Habituation - Tip Card  1.The goal of habituation training is to assist in decreasing symptoms of vertigo, dizziness, or nausea provoked by specific head and body motions. 2.These exercises may initially increase symptoms; however, be persistent and work through symptoms. With repetition and time, the exercises will assist in reducing or eliminating symptoms. 3.Exercises should be stopped and discussed with the therapist if you experience any of the following: - Sudden change or fluctuation in hearing - New onset of ringing in the ears, or increase in current intensity - Any fluid discharge from the ear - Severe pain in neck or back - Extreme nausea  Copyright  VHI. All rights reserved.    Habituation - Sit to Side-Lying   Sit on edge of bed. Lie down onto the right side and hold until dizziness stops, plus 20 seconds.  Return to sitting and wait until dizziness stops, plus 20 seconds.  Repeat to the left side. Repeat sequence 5 times per session. Do 2 sessions per day.  Copyright  VHI. All rights reserved.

## 2016-07-18 ENCOUNTER — Ambulatory Visit: Payer: BC Managed Care – PPO | Admitting: Rehabilitative and Restorative Service Providers"

## 2016-07-18 ENCOUNTER — Telehealth: Payer: Self-pay | Admitting: Rehabilitative and Restorative Service Providers"

## 2016-07-18 NOTE — Telephone Encounter (Signed)
Brandi Cherry made contact via e-mail ([email or web address]) to let PT know she was cancelling visit on 07/17/2016 due to inclement weather.  PT acknowledged cancel and she responded with the below:  Good morning [name],  I was curious to know if you think I should still visit the ENT or if I should be seeking out a neurologist to help with migraines. The more I read about what you and I discussed last Friday the more I think it is migraine related. I actually had  three headaches this past week since I have seen you . I wouldn't call them full-blown migraines  but it's kind of like a nagging ache over my left eye . The ringing in my ears has stopped.  It was only for those two days and I don't have any ear fullness. I just don't want to spend a lot of money going to the wrong type of doctor.  I would appreciate your guidance on this as I have an ENT appointment booked for January 25th.  Brandi Cherry  PT responded via e-mail:  If the ringing in your ears has improved, you may choose to wait on seeing a specialist.  If headaches continue, I would call your primary care MD to see if they would refer you to neurology due to continued headaches and/or recent change in headache characteristics (happening more often, occurring with dizziness, having greater motion sensitivity, etc).    We can discuss further at your next session, but if symptoms are improving you may find that you can cancel ENT appointment.  Your MD recommended it, so you may let them know why you were going to wait-noting that ringing in ears has improved.

## 2016-07-25 ENCOUNTER — Ambulatory Visit: Payer: BC Managed Care – PPO | Admitting: Rehabilitative and Restorative Service Providers"

## 2016-07-25 DIAGNOSIS — R42 Dizziness and giddiness: Secondary | ICD-10-CM | POA: Diagnosis not present

## 2016-07-25 DIAGNOSIS — R2689 Other abnormalities of gait and mobility: Secondary | ICD-10-CM

## 2016-07-25 NOTE — Therapy (Signed)
[facility] [address] [place], [place], [address] Phone: [phone]   Fax:  [phone]  Physical Therapy Treatment  Patient Details  Name: Brandi Cherry MRN: [id number] Date of Birth: [birth year] Referring Provider: Murray Hodgkins, MD  Encounter Date: 07/25/2016      PT End of Session - 07/25/16 0943    Visit Number 2   Number of Visits 6   Date for PT Re-Evaluation 08/25/16   Authorization Type Private insurance- check for authorization   PT Start Time 0932   PT Stop Time 1017   PT Time Calculation (min) 45 min   Activity Tolerance Patient tolerated treatment well   Behavior During Therapy [facility] for tasks assessed/performed      Past Medical History:  Diagnosis Date  . Adhesive capsulitis of shoulder 2014   s/p GH steroid injection  . 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 Teena Dunk at [facility])  . History of miscarriage    G5P2  . History of UTI    tends to get with relations  . 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 Hx of migraines    with cycle  . Melanoma in situ of face [facility])    gets yearly skin checks [name])  . 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 Urine incontinence   . Vertigo     Past Surgical History:  Procedure Laterality Date  . CARDIAC ELECTROPHYSIOLOGY STUDY AND ABLATION  04/2013   AVNRT ([facility] Dr[facility])  . DILATION AND CURETTAGE OF UTERUS  05/2013   uterine polyp with menorrhagia  . hospitalization  2002   urosepsis, again with son    There were no vitals filed for this visit.      Subjective Assessment - 07/25/16 0930    Subjective The patient notes that she has been doing better.  She made modifications at her office (reduced overhead lighting, changed office set up to avoid moving R<>L quickly).  She reports "I have mastered the E on the wall and my eye is not drifting".  She  notes that lying down<>looking up is not bad.  She reports at work in busy environments (overhead lights, shiny floor) she gets anterior/posterior sway and leaning.  She reports she has had good days in which she feels back to normal, but other days she intermittently felt dizziness.  She is sleeping in her bed now with a high mattress, however still feels some concern about moving/rolling in the bed (both prior incidents happened getting in/out of bed).  She notes she doesn't know what to do about her eye glasses--she notes it feels like "too much stimulation" when she wears them.  "I still don't feel great, but I'm glad I made it through the week."  She feels symptoms are better at home, however she is still not rolling in bed.  She also notes headaches approximately 4 days/week.   Pertinent History h/o migraines (atleast 4 days/month during hormonal changes, light sensitivity headaches), motion sickness, h/o cardiac ablation due to AV tachycardia.   Patient Stated Goals "get back to normal functioning."   Currently in Pain? No/denies                         [facility] Adult PT Treatment/Exercise - 07/25/16 1141      Self-Care  Self-Care Other Self-Care Comments   Other Self-Care Comments  home walking program discussed for general conditioning.         Vestibular Treatment/Exercise - 07/25/16 0944      Vestibular Treatment/Exercise   Vestibular Treatment Provided Habituation;Gaze   Habituation Exercises Standing Horizontal Head Turns;Standing Vertical Head Turns   Gaze Exercises X1 Viewing Horizontal     Nestor Lewandowsky   Number of Reps  2   Symptom Description  no dizziness today     Standing Horizontal Head Turns   Number of Reps  10   Symptom Description  standing on compliant surfaces with feet apart and head motion.     Standing Vertical Head Turns   Number of Reps  10   Symptom Description  standing on compliant surfaces with feet apart and eyes closed     X1  Viewing Horizontal   Foot Position standing feet apart and then progressing to partial heel to toe   Comments x 30 seconds, recommended working up to 45 seconds and then 60 at home for progression of HEP.                PT Education - 07/25/16 1017    Education provided Yes   Education Details HEP: VOR, habituation rolling, balance on pillow with eyes closed adding head motion   Person(s) Educated Patient   Methods Explanation;Demonstration;Handout   Comprehension Verbalized understanding;Returned demonstration          PT Short Term Goals - 07/11/16 1133      PT SHORT TERM GOAL #1   Title STGs=LTGs           PT Long Term Goals - 07/25/16 1138      PT LONG TERM GOAL #1   Title The patient will return demo HEP for motion sensitivity, gaze adaptation and general functional mobility.   Baseline Met on 07/25/2016   Time 4   Period Weeks   Status Achieved     PT LONG TERM GOAL #2   Title The patient will improve DHI from 64% to < or equal to 48% to demo dec'd self perception of vertigo   Baseline Target date 08/11/2016   Time 4   Period Weeks   Status On-going     PT LONG TERM GOAL #3   Title The patient will tolerate gaze x 1 adaptation to tolerate x 60 seconds nonstop without c/o visual blurring ( at self regulated pace).   Baseline Target date 08/11/2016   Time 4   Period Weeks     PT LONG TERM GOAL #4   Title The patient will tolerate sit<>bilateral sidelying without c/o dizziness or nausea (notes 2-3/10 symptoms)   Baseline Met on 07/25/2016.   Time 4   Period Weeks   Status Achieved     PT LONG TERM GOAL #5   Title The patient will be able to return to work verbalizing strategies to reduce symtpoms in busy environments for improved tolerance to movement/visual stimulation.   Baseline Target date 08/11/2016   Time 4   Period Weeks   Status On-going               Plan - 07/25/16 1138    Clinical Impression Statement The patient continues with  some motion sensitivity in daily activities.  She has variability between days and does not regular c/o headaches.  Patient met 2 LTGs.  PT progressed home exercise program and plans to continue to work towards remaining 2  LTGs.    PT Treatment/Interventions ADLs/Self Care Home Management;Vestibular;Canalith Repostioning;Therapeutic activities;Therapeutic exercise;Neuromuscular re-education;Patient/family education;Functional mobility training;Gait training   PT Next Visit Plan Check HEP, walking program.  Discuss further integration into busy environments.   Consulted and Agree with Plan of Care Patient      Patient will benefit from skilled therapeutic intervention in order to improve the following deficits and impairments:  Abnormal gait, Dizziness, Impaired vision/preception, Decreased activity tolerance  Visit Diagnosis: Dizziness and giddiness  Other abnormalities of gait and mobility     Problem List Patient Active Problem List   Diagnosis Date Noted  . Benign paroxysmal positional vertigo of left ear 07/07/2016  . Post-nasal drip 01/09/2015  . Enlarged tonsils 01/09/2015  . Acute pharyngitis 12/29/2014  . Dry mouth 12/29/2014  . Acute streptococcal pharyngitis 11/02/2014  . Rib pain 07/18/2014  . Heel pain, bilateral 02/23/2014  . 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 AVNRT (AV nodal re-entry tachycardia) ([place]) 03/17/2013  . 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 Lower back pain 11/11/2011  . Palpitation   . Hx of migraines   . HSV-2 (herpes simplex virus 2) infection   . Melanoma in situ of face ([place])   . History of UTI     Jelesa Mangini, PT 07/25/2016, 11:43 AM  [facility] [address] [place], [place], [address] Phone: [phone]   Fax:  [phone]  Name: Brandi Cherry MRN: [id number] Date of Birth: [birth year]

## 2016-07-25 NOTE — Patient Instructions (Signed)
Habituation - Tip Card  1.The goal of habituation training is to assist in decreasing symptoms of vertigo, dizziness, or nausea provoked by specific head and body motions. 2.These exercises may initially increase symptoms; however, be persistent and work through symptoms. With repetition and time, the exercises will assist in reducing or eliminating symptoms. 3.Exercises should be stopped and discussed with the therapist if you experience any of the following: - Sudden change or fluctuation in hearing - New onset of ringing in the ears, or increase in current intensity - Any fluid discharge from the ear - Severe pain in neck or back - Extreme nausea  Copyright  VHI. All rights reserved.   Habituation - Rolling   With pillow under head, start on back. Roll to your right side.  Hold until dizziness stops, plus 20 seconds and then roll to the left side.  Hold until dizziness stops, plus 20 seconds.  Repeat sequence 5 times per session. Do 2 sessions per day.   Gaze Stabilization - Tip Card  1.Target must remain in focus, not blurry, and appear stationary while head is in motion. 2.Perform exercises with small head movements (45 to either side of midline). 3.Increase speed of head motion so long as target is in focus. 4.If you wear eyeglasses, be sure you can see target through lens (therapist will give specific instructions for bifocal / progressive lenses). 5.These exercises may provoke dizziness or nausea. Work through these symptoms. If too dizzy, slow head movement slightly. Rest between each exercise. 6.Exercises demand concentration; avoid distractions. 7.For safety, perform standing exercises close to a counter, wall, corner, or next to someone.  Copyright  VHI. All rights reserved.   Gaze Stabilization - Standing Feet Apart   Feet shoulder width apart, keeping eyes on target on wall 3 feet away, tilt head down slightly and move head side to side for 30 seconds. Repeat while  moving head up and down for 30 seconds. Do 2-3 sessions per day.  WITH GLASSES: Stand 5-8 ft from target wearing glasses Do 10 repetitions of side to side, let symptoms settle and then repeat up and down 10 times.    Do 2-3 times/day.  Gaze Stabilization: Standing Feet Partial Heel-Toe    Feet in partial heel-toe position, keeping eyes fixed on target on wall _3___ feet away, tilt head down slightly and move head side to side for __20 times. Do __2__ sessions per day.  Copyright  VHI. All rights reserved.   Feet Together (Compliant Surface) Varied Arm Positions - Eyes Closed    Stand on compliant surface: __pillow______ with feet together and arms out. Close eyes and visualize upright position. Hold__30__ seconds. Repeat __2__ times per session. Do __2__ sessions per day.  Copyright  VHI. All rights reserved.   Feet Apart (Compliant Surface) Head Motion - Eyes Closed    Stand on compliant surface: __pillow______ with feet shoulder width apart. Close eyes and move head slowly, up and down x 10 reps.  Open eyes and let symptoms settle.  Repeat side to side x 10 reps.  Do __2__ sessions per day.  Copyright  VHI. All rights reserved.

## 2016-07-31 DIAGNOSIS — D164 Benign neoplasm of bones of skull and face: Secondary | ICD-10-CM | POA: Insufficient documentation

## 2016-07-31 DIAGNOSIS — R55 Syncope and collapse: Secondary | ICD-10-CM | POA: Insufficient documentation

## 2016-08-02 DIAGNOSIS — K219 Gastro-esophageal reflux disease without esophagitis: Secondary | ICD-10-CM | POA: Insufficient documentation

## 2016-08-13 ENCOUNTER — Ambulatory Visit: Payer: BC Managed Care – PPO | Attending: Family Medicine | Admitting: Rehabilitative and Restorative Service Providers"

## 2016-08-13 DIAGNOSIS — R42 Dizziness and giddiness: Secondary | ICD-10-CM

## 2016-08-13 DIAGNOSIS — R2689 Other abnormalities of gait and mobility: Secondary | ICD-10-CM | POA: Diagnosis present

## 2016-08-13 NOTE — Therapy (Signed)
[facility] [address] [place], [place], [address] Phone: [phone]   Fax:  [phone]  Physical Therapy Treatment and Discharge summary  Patient Details  Name: Brandi Cherry MRN: [id number] Date of Birth: [birth year] Referring Provider: Murray Hodgkins, MD  Encounter Date: 08/13/2016      PT End of Session - 08/13/16 1048    Visit Number 3   Number of Visits 6   Date for PT Re-Evaluation 08/25/16   Authorization Type Private insurance- check for authorization   PT Start Time 1022   PT Stop Time 1050   PT Time Calculation (min) 28 min   Activity Tolerance Patient tolerated treatment well   Behavior During Therapy [facility] for tasks assessed/performed      Past Medical History:  Diagnosis Date  . Adhesive capsulitis of shoulder 2014   s/p GH steroid injection  . 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 [facility]ena [facility]unk at [facility])  . History of miscarriage    G5P2  . History of UTI    tends to get with relations  . 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 Hx of migraines    with cycle  . Melanoma in situ of face [facility])    gets yearly skin checks [name])  . 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 Urine incontinence   . Vertigo     Past Surgical History:  Procedure Laterality Date  . CARDIAC ELECTROPHYSIOLOGY STUDY AND ABLATION  04/2013   AVNRT (EP Dr. Teena Dunk at [facility])  . DILATION AND CURETTAGE OF UTERUS  05/2013   uterine polyp with menorrhagia  . hospitalization  2002   urosepsis, again with son    There were no vitals filed for this visit.      Subjective Assessment - 08/13/16 1024    Subjective The patient saw Dr. Erik Obey last week and she had full hearing test.  She has been working daily and is avoiding overhead lights.  She still notes intermittent tinnitus, difficulty in large/open spaces and long hallways, as well  as continued light sensitivity.  She still notes difficulty with eyes closed feet narrow.    Since changing lights at work, she has not had any incidence of headache.     Pertinent History h/o migraines (atleast 4 days/month during hormonal changes, light sensitivity headaches), motion sickness, h/o cardiac ablation due to AV tachycardia.   Patient Stated Goals "get back to normal functioning."   Currently in Pain? No/denies                         [facility] Adult PT Treatment/Exercise - 08/13/16 2118      Self-Care   Self-Care Other Self-Care Comments   Other Self-Care Comments  Discussed compensatory strategies in areas that continue to be concerning including long hallways and larger department stores.       Neuro Re-ed    Neuro Re-ed Details  Reviewed gaze x 1 viewing exercises and patient completed x 60 seconds without vertigo/dizziness.  The patient performed compliant surface standing with eyes open and eyes closed.  Gait activities with head motion horizontal and vertical without loss of balance.                    PT Short Term Goals - 07/11/16 1133  PT SHORT TERM GOAL #1   Title STGs=LTGs           PT Long Term Goals - 08/13/16 1031      PT LONG TERM GOAL #1   Title The patient will return demo HEP for motion sensitivity, gaze adaptation and general functional mobility.   Baseline Met on 07/25/2016   Time 4   Period Weeks   Status Achieved     PT LONG TERM GOAL #2   Title The patient will improve DHI from 64% to < or equal to 48% to demo dec'd self perception of vertigo   Baseline Improved from 64% to 14% at discharge.   Time 4   Period Weeks   Status Achieved     PT LONG TERM GOAL #3   Title The patient will tolerate gaze x 1 adaptation to tolerate x 60 seconds nonstop without c/o visual blurring ( at self regulated pace).   Baseline Met on 08/13/2016   Time 4   Period Weeks   Status Achieved     PT LONG TERM GOAL #4   Title The  patient will tolerate sit<>bilateral sidelying without c/o dizziness or nausea (notes 2-3/10 symptoms)   Baseline Met on 07/25/2016.   Time 4   Period Weeks   Status Achieved     PT LONG TERM GOAL #5   Title The patient will be able to return to work verbalizing strategies to reduce symtpoms in busy environments for improved tolerance to movement/visual stimulation.   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     Time 4   Period Weeks   Status Achieved               Plan - 08/13/16 2115    Clinical Impression Statement The patient met all LTGs.  She continues with intermittent sensations of postural sway when walking down long halls and in large, open spaces.  She has decreased headaches since reducing overhead light use in her office.  She is still using intermittent visual correction/lenses and this could slow overall vestibular adaptation.    PT Treatment/Interventions ADLs/Self Care Home Management;Vestibular;Canalith Repostioning;Therapeutic activities;Therapeutic exercise;Neuromuscular re-education;Patient/family education;Functional mobility training;Gait training   PT Next Visit Plan Discharge.   Consulted and Agree with Plan of Care Patient      Patient will benefit from skilled therapeutic intervention in order to improve the following deficits and impairments:  Abnormal gait, Dizziness, Impaired vision/preception, Decreased activity tolerance  Visit Diagnosis: Dizziness and giddiness  Other abnormalities of gait and mobility    PHYSICAL THERAPY DISCHARGE SUMMARY  Visits from Start of Care: 3  Current functional level related to goals / functional outcomes: See above   Remaining deficits: Long hallways and open spaces create a sensation of postural sway Headaches with overhead lights   Education / Equipment: Home program, general wellness.  Plan: Patient agrees to discharge.  Patient  goals were met. Patient is being discharged due to meeting the stated rehab goals.  ?????        Thank you for the referral of this patient. Rudell Cobb, MPT    [place], PT 08/13/2016, 9:21 PM  [facility] [address] [place], [place], [address] Phone: [phone]   Fax:  [phone]  Name: Brandi Cherry MRN: [id number] Date of Birth: 04/24/1966

## 2016-08-15 ENCOUNTER — Ambulatory Visit: Payer: BC Managed Care – PPO | Admitting: Rehabilitative and Restorative Service Providers"

## 2016-09-11 ENCOUNTER — Ambulatory Visit (INDEPENDENT_AMBULATORY_CARE_PROVIDER_SITE_OTHER): Payer: BC Managed Care – PPO | Admitting: Adult Health

## 2016-09-11 VITALS — BP 128/70 | Temp 98.4°F | Wt 165.8 lb

## 2016-09-11 DIAGNOSIS — H6690 Otitis media, unspecified, unspecified ear: Secondary | ICD-10-CM

## 2016-09-11 MED ORDER — AMOXICILLIN 500 MG PO CAPS: 500.0000 mg | ORAL_CAPSULE | Freq: Two times a day (BID) | ORAL | 0 refills | Status: DC

## 2016-09-11 NOTE — Progress Notes (Signed)
Subjective:    Patient ID: Brandi Cherry, female    DOB: [birth year], 51 y.o.   MRN: [id number]  Otalgia   There is pain in the left ear. This is a recurrent problem. The current episode started yesterday. The problem has been gradually worsening. There has been no fever. Associated symptoms include a sore throat. Pertinent negatives include no ear discharge, hearing loss or rhinorrhea. She has tried acetaminophen for the symptoms. The treatment provided moderate relief. There is no history of a chronic ear infection, hearing loss or a tympanostomy tube.      Review of Systems  Constitutional: Negative.   HENT: Positive for ear pain and sore throat. Negative for ear discharge, hearing loss, rhinorrhea, sinus pain, sinus pressure and trouble swallowing.   Respiratory: Negative.   Cardiovascular: Negative.   Hematological: Positive for adenopathy.  All other systems reviewed and are negative.  Past Medical History:  Diagnosis Date  . Adhesive capsulitis of shoulder 2014   s/p GH steroid injection  . 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 Teena [facility]unk at [facility])  . History of miscarriage    G5P2  . History of UTI    tends to get with relations  . 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 Hx of migraines    with cycle  . Melanoma in situ of face [facility])    gets yearly skin checks [name])  . 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 Urine incontinence   . Vertigo     Social History   Social History  . Marital status: Married    Spouse name: N/A  . Number of children: N/A  . Years of education: N/A   Occupational History  . Not on file.   Social History Main Topics  . Smoking status: Never Smoker  . Smokeless tobacco: Never Used  . Alcohol use No  . Drug use: No  . Sexual activity: Not on file   Other Topics Concern  . Not on file   Social History Narrative   Caffeine: occasional tea   Lives with husband, son and daughter   Occupation; Animal nutritionist at [employer]   Edu: Masters   Activity: none currently   Diet: good water, daily fruits/vegetables, red meat 1x/wk, fish 2x/wk    Past Surgical History:  Procedure Laterality Date  . CARDIAC ELECTROPHYSIOLOGY STUDY AND ABLATION  04/2013   AVNRT (EP Dr. Teena Dunk at [facility])  . DILATION AND CURETTAGE OF UTERUS  05/2013   uterine polyp with menorrhagia  . hospitalization  2002   urosepsis, again with son    Family History  Problem Relation Age of Onset  . Thyroid disease Mother 33  . Coronary artery disease Father 57    MI  . Hypertension Father   . Cancer Maternal Uncle     unsure  . Mental retardation Maternal Grandmother     bipolar  . Arthritis Other     strong on mother's side, no RA though  . Diabetes Neg Hx     No Known Allergies  Current Outpatient Prescriptions on File Prior to Visit  Medication Sig Dispense Refill  . valACYclovir (VALTREX) 500 MG tablet Take 500 mg by mouth daily as needed.      No current facility-administered medications on file prior to visit.     BP 128/70 (BP Location: Left Arm,  Patient Position: Sitting, Cuff Size: Normal)   Temp 98.4 F (36.9 C) (Oral)   Wt 165 lb 12.8 oz (75.2 kg)   BMI 26.76 kg/m       Objective:   Physical Exam  Constitutional: She is oriented to person, place, and time. She appears well-developed and well-nourished. No distress.  HENT:  Right Ear: Hearing, tympanic membrane, external ear and ear canal normal. Tympanic membrane is not erythematous and not bulging.  Left Ear: Hearing, external ear and ear canal normal. Tympanic membrane is erythematous and bulging. No decreased hearing is noted.  Nose: Right sinus exhibits no maxillary sinus tenderness and no frontal sinus tenderness. Left sinus exhibits no maxillary sinus tenderness and no frontal sinus tenderness.  Mouth/Throat: Uvula is midline, oropharynx is clear and moist and mucous  membranes are normal.  Cardiovascular: Normal rate, regular rhythm, normal heart sounds and intact distal pulses.  Exam reveals no gallop and no friction rub.   No murmur heard. Pulmonary/Chest: Effort normal and breath sounds normal. No respiratory distress. She has no wheezes. She has no rales. She exhibits no tenderness.  Lymphadenopathy:       Head (left side): Tonsillar, preauricular and posterior auricular adenopathy present.  Neurological: She is alert and oriented to person, place, and time.  Skin: Skin is warm and dry. No rash noted. She is not diaphoretic. No erythema. No pallor.  Psychiatric: She has a normal mood and affect. Her behavior is normal. Judgment and thought content normal.  Nursing note and vitals reviewed.     Assessment & Plan:  1. Acute otitis media, unspecified otitis media type - amoxicillin (AMOXIL) 500 MG capsule; Take 1 capsule (500 mg total) by mouth 2 (two) times daily.  Dispense: 20 capsule; Refill: 0 - Continue with Advil  - Can add flonase - Follow up with PCP if no improvement   Dorothyann Peng, NP

## 2016-09-25 ENCOUNTER — Ambulatory Visit (INDEPENDENT_AMBULATORY_CARE_PROVIDER_SITE_OTHER): Payer: BC Managed Care – PPO | Admitting: Family Medicine

## 2016-09-25 ENCOUNTER — Encounter: Payer: Self-pay | Admitting: Family Medicine

## 2016-09-25 VITALS — BP 102/80 | HR 93 | Temp 97.9°F | Ht 66.0 in | Wt 156.8 lb

## 2016-09-25 DIAGNOSIS — J329 Chronic sinusitis, unspecified: Secondary | ICD-10-CM | POA: Diagnosis not present

## 2016-09-25 DIAGNOSIS — B9689 Other specified bacterial agents as the cause of diseases classified elsewhere: Secondary | ICD-10-CM

## 2016-09-25 MED ORDER — PREDNISONE 20 MG PO TABS: ORAL_TABLET | ORAL | 0 refills | Status: DC

## 2016-09-25 MED ORDER — AMOXICILLIN-POT CLAVULANATE 875-125 MG PO TABS: 1.0000 | ORAL_TABLET | Freq: Two times a day (BID) | ORAL | 0 refills | Status: DC

## 2016-09-25 NOTE — Progress Notes (Signed)
Pre visit review using our clinic review tool, if applicable. No additional management support is needed unless otherwise documented below in the visit note. 

## 2016-09-25 NOTE — Progress Notes (Signed)
PCP: Ria Bush, MD  Subjective:  Brandi Cherry is a 51 y.o. year old very pleasant female patient who presents with sinusitis symptoms including nasal congestion, sinus tenderness. She also has a dry cough that sometimes will cause some pain in her chest.   About 3 weeks ago treated for ear infection with amoxicillin which resolved symptoms completely. Then got sick again several days later.   -other symptoms include: fatigue, mild sore throat at times. Feels so stopped up but not draining much from nose other than clear today -day of illness:10 days at least -Symptoms show no change -previous treatments: mucinex makes her feel more dry -sick contacts/travel/risks: denies flu exposure. 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 Benign paroxysmal positional vertigo of left ear 07/07/2016  . Post-nasal drip 01/09/2015  . Enlarged tonsils 01/09/2015  . Acute pharyngitis 12/29/2014  . Dry mouth 12/29/2014  . Acute streptococcal pharyngitis 11/02/2014  . Rib pain 07/18/2014  . Heel pain, bilateral 02/23/2014  . 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 AVNRT (AV nodal re-entry tachycardia) ([place]) 03/17/2013  . 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 Lower back pain 11/11/2011  . Palpitation   . Hx of migraines   . HSV-2 (herpes simplex virus 2) infection   . Melanoma in situ of face ([place])   . History of UTI     Medications- reviewed  Current Outpatient Prescriptions  Medication Sig Dispense Refill  . ibuprofen (ADVIL,MOTRIN) 200 MG tablet Take 200 mg by mouth every 6 (six) hours as needed.    . valACYclovir (VALTREX) 500 MG tablet Take 500 mg by mouth daily as needed.      No current facility-administered medications for this visit.     Objective: BP 102/80 (BP Location: Left Arm, Patient Position: Sitting, Cuff Size:  Normal)   Pulse 93   Temp 97.9 F (36.6 C) (Oral)   Ht 5\' 6"  (1.676 m)   Wt 156 lb 12.8 oz (71.1 kg)   SpO2 95%   BMI 25.31 kg/m  Gen: NAD, resting comfortably HEENT: Turbinates erythematous with yellow drainage, TM normal, pharynx mildly erythematous with no tonsilar exudate or edema, frontal bilateral sinus tenderness CV: RRR no murmurs rubs or gallops Lungs: CTAB no crackles, wheeze, rhonchi Abdomen: soft/nontender/nondistended/normal bowel sounds. No rebound or guarding.  Ext: no edema Skin: warm, dry, no rash Neuro: grossly normal, moves all extremities  Assessment/Plan:  Sinsusitis Bacterial based on: Symptoms >10 days  Treatment: -considered steroid: we opted to use prednisone -other symptomatic care with ibuprofen or tylenol. Often use mucinex but she feels so dry already may make her feel worse -Antibiotic indicated: Augmentin 7 day course  Finally, we reviewed reasons to return to care including if symptoms worsen or persist or new concerns arise (particularly fever or shortness of breath)  Meds ordered this encounter  Medications  . amoxicillin-clavulanate (AUGMENTIN) 875-125 MG tablet    Sig: Take 1 tablet by mouth 2 (two) times daily.    Dispense:  14 tablet    Refill:  0  . predniSONE (DELTASONE) 20 MG tablet    Sig: Take 1 tablet by mouth daily for 5 days, then 1/2 tablet daily for 2 days    Dispense:  6 tablet    Refill:  0    Garret Reddish, MD

## 2016-09-25 NOTE — Patient Instructions (Signed)
Sinsusitis Bacterial based on: Symptoms >10 days, double sickening, or severe symptoms in first 3 days  Treatment: -considered steroid: we opted to use prednisone -other symptomatic care with ibuprofen or tylenol. Often use mucinex but she feels so dry already may make her feel worse -Antibiotic indicated: Augmentin 7 day course  Finally, we reviewed reasons to return to care including if symptoms worsen or persist or new concerns arise (particularly fever or shortness of breath)  Meds ordered this encounter  Medications  . amoxicillin-clavulanate (AUGMENTIN) 875-125 MG tablet    Sig: Take 1 tablet by mouth 2 (two) times daily.    Dispense:  14 tablet    Refill:  0  . predniSONE (DELTASONE) 20 MG tablet    Sig: Take 1 tablet by mouth daily for 5 days, then 1/2 tablet daily for 2 days    Dispense:  6 tablet    Refill:  0

## 2016-10-10 ENCOUNTER — Ambulatory Visit (INDEPENDENT_AMBULATORY_CARE_PROVIDER_SITE_OTHER): Payer: BC Managed Care – PPO | Admitting: Family Medicine

## 2016-10-10 ENCOUNTER — Encounter: Payer: Self-pay | Admitting: Family Medicine

## 2016-10-10 VITALS — BP 118/82 | HR 92 | Temp 98.3°F | Wt 152.2 lb

## 2016-10-10 DIAGNOSIS — R202 Paresthesia of skin: Secondary | ICD-10-CM | POA: Diagnosis not present

## 2016-10-10 DIAGNOSIS — R251 Tremor, unspecified: Secondary | ICD-10-CM | POA: Diagnosis not present

## 2016-10-10 DIAGNOSIS — R42 Dizziness and giddiness: Secondary | ICD-10-CM | POA: Diagnosis not present

## 2016-10-10 LAB — VITAMIN B12: VITAMIN B 12: 602 pg/mL (ref 211–911)

## 2016-10-10 LAB — MAGNESIUM: Magnesium: 2.2 mg/dL (ref 1.5–2.5)

## 2016-10-10 LAB — TSH: TSH: 1.3 u[IU]/mL (ref 0.35–4.50)

## 2016-10-10 LAB — FOLATE

## 2016-10-10 MED ORDER — LORAZEPAM 0.5 MG PO TABS: 0.2500 mg | ORAL_TABLET | Freq: Every evening | ORAL | 0 refills | Status: DC | PRN

## 2016-10-10 MED ORDER — AMITRIPTYLINE HCL 25 MG PO TABS: 12.5000 mg | ORAL_TABLET | Freq: Every day | ORAL | 1 refills | Status: DC

## 2016-10-10 NOTE — Progress Notes (Signed)
Pre visit review using our clinic review tool, if applicable. No additional management support is needed unless otherwise documented below in the visit note. 

## 2016-10-10 NOTE — Progress Notes (Signed)
BP 118/82   Pulse 92   Temp 98.3 F (36.8 C) (Oral)   Wt 152 lb 4 oz (69.1 kg)   LMP 04/30/2016   BMI 24.57 kg/m    CC: dizziness, tremors Subjective:    Patient ID: Brandi Cherry, female    DOB: 12/28/1965, 51 y.o.   MRN: [id number]  HPI: Brandi Cherry is a 51 y.o. female presenting on 10/10/2016 for Tremors ("internal"; feels like whole body is vibrating) and Dizziness   Seen twice in the past month with AOM (amoxicillin treatment) then sinusitis (augmentin and prednisone treatment) at [employer]. Notes reviewed. Fully better from this.   Ongoing vertigo over last 4-6 months. Initially thought BPPV but as worsening, we referred to ENT 06/2016 - meniere's disease ruled out.  She did complete vestibular rehab which was helpful but very expensive. BPPV symptoms resolved. Continues home exercises which remain helpful. PT thought possible vertiginous migraines.   Ongoing rocking swaying feeling especially when walking long halls at school. Ongoing unsteady sensation worse indoors. Over the last 1 month noticing hand tremors and some intermittent paresthesias. Has awakened last several nights with vibration sensation in body without external movement. This seems to be progressively worsening. Acutely worse last night with ongoing pulsatile internal tremors - without external movement. Checked HR - normal. Actually feels best when driving or otherwise in motion. Took zofran for nausea today.   H/o typical migraines without aura a few a month, controlled with advil. At times wakes up with a headache, at times headache wakes her up. Intermittent TTH more frequent than migraines.   Denies fevers, slurred speech, numbness or weakness.   Several trips recently - [place], [place] with children. Did ok during trips.   Perimenopausal - LMP 8 months ago.  h/o AVNRT s/p ablation 2014 ([facility]).  No regular exercise - afraid due to vertigo.  She has previously been told she has fibromyalgia.    Relevant past medical, surgical, family and social history reviewed and updated as indicated. Interim medical history since our last visit reviewed. Allergies and medications reviewed and updated. Outpatient Medications Prior to Visit  Medication Sig Dispense Refill  . ibuprofen (ADVIL,MOTRIN) 200 MG tablet Take 200 mg by mouth every 6 (six) hours as needed.    . valACYclovir (VALTREX) 500 MG tablet Take 500 mg by mouth daily as needed.     Marland Kitchen amoxicillin-clavulanate (AUGMENTIN) 875-125 MG tablet Take 1 tablet by mouth 2 (two) times daily. 14 tablet 0  . predniSONE (DELTASONE) 20 MG tablet Take 1 tablet by mouth daily for 5 days, then 1/2 tablet daily for 2 days 6 tablet 0   No facility-administered medications prior to visit.      Per HPI unless specifically indicated in ROS section below Review of Systems     Objective:    BP 118/82   Pulse 92   Temp 98.3 F (36.8 C) (Oral)   Wt 152 lb 4 oz (69.1 kg)   LMP 04/30/2016   BMI 24.57 kg/m   Wt Readings from Last 3 Encounters:  10/10/16 152 lb 4 oz (69.1 kg)  09/25/16 156 lb 12.8 oz (71.1 kg)  09/11/16 165 lb 12.8 oz (75.2 kg)    Physical Exam  Constitutional: She is oriented to person, place, and time. She appears well-developed and well-nourished. No distress.  HENT:  Head: Normocephalic and atraumatic.  Right Ear: Hearing, tympanic membrane, external ear and ear canal normal.  Left Ear: Hearing, tympanic membrane, external ear and  ear canal normal.  Nose: No mucosal edema or rhinorrhea. Right sinus exhibits no maxillary sinus tenderness and no frontal sinus tenderness. Left sinus exhibits no maxillary sinus tenderness and no frontal sinus tenderness.  Mouth/Throat: Uvula is midline, oropharynx is clear and moist and mucous membranes are normal. No oropharyngeal exudate, posterior oropharyngeal edema, posterior oropharyngeal erythema or tonsillar abscesses.  Eyes: Conjunctivae and EOM are normal. Pupils are equal, round, and  reactive to light. No scleral icterus.  Neck: Normal range of motion. Neck supple. No thyromegaly present.  Cardiovascular: Normal rate, regular rhythm, normal heart sounds and intact distal pulses.   No murmur heard. Pulmonary/Chest: Effort normal and breath sounds normal. No respiratory distress. She has no wheezes. She has no rales.  Musculoskeletal: She exhibits no edema.  Lymphadenopathy:    She has no cervical adenopathy.  Neurological: She is alert and oriented to person, place, and time. She has normal strength. No cranial nerve deficit or sensory deficit. She displays a negative Romberg sign. Coordination and gait normal.  CN 2-12 intact FTN and HTS intact EOMI No dysdiadochokinesia No pronator drift Mild postural tremor present  Skin: Skin is warm and dry. No rash noted.  Psychiatric: Her behavior is normal. Thought content normal. Her mood appears anxious.  Tearful with discussion of unexplained symptoms  Nursing note and vitals reviewed.    CT HEAD WITHOUT CONTRAST TECHNIQUE: Contiguous axial images were obtained from the base of the skull through the vertex without intravenous contrast. COMPARISON:  None. FINDINGS: Brain: No acute intracranial abnormality. Specifically, no hemorrhage, hydrocephalus, mass lesion, acute infarction, or significant intracranial injury. Vascular: No hyperdense vessel or unexpected calcification. Skull: No acute calvarial abnormality. Sinuses/Orbits: Visualized paranasal sinuses and mastoids clear. Orbital soft tissues unremarkable. Other: None IMPRESSION: No intracranial abnormality. Electronically Signed   By: Rolm Baptise M.D.   On: 06/29/2016 16:10    Assessment & Plan:   Problem List Items Addressed This Visit    Paresthesia    Check for vitamin deficiencies.  Check TSH.       Relevant Orders   TSH (Completed)   Vitamin B12 (Completed)   Folate (Completed)   Magnesium (Completed)   Tremor    Describes internal  vibration-like tremor sensation worsening over last several days, that is unnerving. No signs of parkinsonisms. ?RLS given symptoms worse at night (although also present during the day).  She does have mild postural tremor on exam - anticipate either heightened physiologic from anxiety, less likely ET      Relevant Orders   Magnesium (Completed)   Vertigo - Primary    BPPV sxs have largely resolved, however persistent intermittent "swaying/rocking" sensation worse indoors along with new internal sensation of tremors. General sense of imbalance and malaise that is progressively worsening. Symptoms are very distressing. Possible vertigo associated with migraine, possible anxiety contribution - will trial lorazepam 1/2 tab at night (as sxs worse at night). If no better, discussed trial low dose elavil at night. Discussed possible side effects of medication and discussed risks of benzodiazepine. Pt overall hesitant for medication. Discussed MRI vs neurology referral. Pt requests neuro evaluation.  s/p ENT referral r/o meinere's. sxs not typical of vestibular neuritis. Vestibular rehab was helpful for presumed peripheral vertigo.           Follow up plan: Return in about 4 weeks (around 11/07/2016), or if symptoms worsen or fail to improve, for follow up visit.  Ria Bush, MD

## 2016-10-10 NOTE — Patient Instructions (Addendum)
Labs today.  Possible vertiginous migraine, possible anxiety contribution,  Try 1/2 tablet ativan for these episodes.  If not helpful, then start 1/2 tablet amitriptyline 25mg  at bedtime for migraine prevention.  We will refer you to neurology for further evaluation of dizzy episodes as well.

## 2016-10-11 DIAGNOSIS — R251 Tremor, unspecified: Secondary | ICD-10-CM | POA: Insufficient documentation

## 2016-10-11 DIAGNOSIS — R202 Paresthesia of skin: Secondary | ICD-10-CM | POA: Insufficient documentation

## 2016-10-11 NOTE — Assessment & Plan Note (Addendum)
Check for vitamin deficiencies.  Check TSH.

## 2016-10-11 NOTE — Assessment & Plan Note (Addendum)
Describes internal vibration-like tremor sensation worsening over last several days, that is unnerving. No signs of parkinsonisms. ?RLS given symptoms worse at night (although also present during the day).  She does have mild postural tremor on exam - anticipate either heightened physiologic from anxiety, less likely ET

## 2016-10-11 NOTE — Assessment & Plan Note (Addendum)
BPPV sxs have largely resolved, however persistent intermittent "swaying/rocking" sensation worse indoors along with new internal sensation of tremors. General sense of imbalance and malaise that is progressively worsening. Symptoms are very distressing. Possible vertigo associated with migraine, possible anxiety contribution - will trial lorazepam 1/2 tab at night (as sxs worse at night). If no better, discussed trial low dose elavil at night. Discussed possible side effects of medication and discussed risks of benzodiazepine. Pt overall hesitant for medication. Discussed MRI vs neurology referral. Pt requests neuro evaluation.  s/p ENT referral r/o meinere's. sxs not typical of vestibular neuritis. Vestibular rehab was helpful for presumed peripheral vertigo.

## 2016-10-21 ENCOUNTER — Encounter: Payer: Self-pay | Admitting: Neurology

## 2016-12-25 ENCOUNTER — Encounter: Payer: Self-pay | Admitting: Family Medicine

## 2016-12-25 ENCOUNTER — Ambulatory Visit (INDEPENDENT_AMBULATORY_CARE_PROVIDER_SITE_OTHER): Payer: BC Managed Care – PPO | Admitting: Family Medicine

## 2016-12-25 VITALS — BP 118/62 | HR 91 | Temp 98.3°F | Ht 66.0 in | Wt 156.0 lb

## 2016-12-25 DIAGNOSIS — H02402 Unspecified ptosis of left eyelid: Secondary | ICD-10-CM | POA: Insufficient documentation

## 2016-12-25 DIAGNOSIS — L608 Other nail disorders: Secondary | ICD-10-CM | POA: Diagnosis not present

## 2016-12-25 NOTE — Progress Notes (Signed)
BP 118/62 (BP Location: Left Arm, Patient Position: Sitting, Cuff Size: Normal)   Pulse 91   Temp 98.3 F (36.8 C) (Oral)   Ht 5\' 6"  (1.676 m)   Wt 156 lb (70.8 kg)   SpO2 98%   BMI 25.18 kg/m    CC: L dry eye Subjective:    Patient ID: Brandi Cherry, female    DOB: [birth year], 51 y.o.   MRN: [id number]  HPI: Brandi Cherry is a 51 y.o. female presenting on 12/25/2016 for Dry Eye (left)   L eye dryness noted over last several months, along with gritty sensation L inner eye. She has also noticed L eye swelling when eyes closed and left upper eyelid droop.   No blurry vision.  Last eye exam 02/2016 - ok.   Ongoing vertigo.  Planning to see ophthalmology - appt until Aug 2018.  L toenail has started falling off. Chronic issue. Lamisil has not helped.   Relevant past medical, surgical, family and social history reviewed and updated as indicated. Interim medical history since our last visit reviewed. Allergies and medications reviewed and updated. Outpatient Medications Prior to Visit  Medication Sig Dispense Refill  . amitriptyline (ELAVIL) 25 MG tablet Take 0.5-1 tablets (12.5-25 mg total) by mouth at bedtime. 30 tablet 1  . ibuprofen (ADVIL,MOTRIN) 200 MG tablet Take 200 mg by mouth every 6 (six) hours as needed.    Marland Kitchen LORazepam (ATIVAN) 0.5 MG tablet Take 0.5-1 tablets (0.25-0.5 mg total) by mouth at bedtime as needed for anxiety (shaking). 30 tablet 0  . valACYclovir (VALTREX) 500 MG tablet Take 500 mg by mouth daily as needed.      No facility-administered medications prior to visit.      Per HPI unless specifically indicated in ROS section below Review of Systems     Objective:    BP 118/62 (BP Location: Left Arm, Patient Position: Sitting, Cuff Size: Normal)   Pulse 91   Temp 98.3 F (36.8 C) (Oral)   Ht 5\' 6"  (1.676 m)   Wt 156 lb (70.8 kg)   SpO2 98%   BMI 25.18 kg/m   Wt Readings from Last 3 Encounters:  12/25/16 156 lb (70.8 kg)  10/10/16 152 lb 4 oz  (69.1 kg)  09/25/16 156 lb 12.8 oz (71.1 kg)    Physical Exam  Constitutional: She appears well-developed and well-nourished. No distress.  HENT:  Right Ear: External ear normal.  Left Ear: External ear normal.  Mouth/Throat: Oropharynx is clear and moist. No oropharyngeal exudate.  Eyes: Conjunctivae, EOM and lids are normal. Pupils are equal, round, and reactive to light. Right eye exhibits no hordeolum. Left eye exhibits no hordeolum. Right conjunctiva is not injected. Left conjunctiva is not injected. No scleral icterus.  Slight excess tissue L upper eyelashes  Slight ptosis of left eyelid  PERRLA, EOM largely intact ?possible difficulty with left eye gaze superiorly and interiorly  Neck: Normal range of motion. Neck supple. No thyromegaly present.  Lymphadenopathy:    She has no cervical adenopathy.  Skin: Skin is warm and dry. No rash noted.  L great lateral toenail absent presumed s/p nailbed ablation, dry skin at nailbed without erythema or drainage  Nursing note and vitals reviewed.  Results for orders placed or performed in visit on 10/10/16  TSH  Result Value Ref Range   TSH 1.30 0.35 - 4.50 uIU/mL  Vitamin B12  Result Value Ref Range   Vitamin B-12 602 211 - 911 pg/mL  Folate  Result Value Ref Range   Folate >24.0 >5.9 ng/mL  Magnesium  Result Value Ref Range   Magnesium 2.2 1.5 - 2.5 mg/dL      Assessment & Plan:   Problem List Items Addressed This Visit    Acquired deformity of toenail    Chronic. Will refer to podiatry.       Relevant Orders   Ambulatory referral to Podiatry   Ptosis, left eyelid - Primary    Anticipate eye irritation not related to newly found acquired L eyelid ptosis, unclear cause.  Will try to expedite ophtho referral as patient is leaving Saturday 2 wks out of town.  I don't think there is concomitant trochlear nerve palsy.  If unable to expedite, consider brain MRI.  rec lubricating eye drops for irritation      Relevant Orders    Ambulatory referral to Ophthalmology       Follow up plan: Return if symptoms worsen or fail to improve.  Ria Bush, MD

## 2016-12-25 NOTE — Assessment & Plan Note (Signed)
Chronic   Will refer to podiatry

## 2016-12-25 NOTE — Assessment & Plan Note (Addendum)
Anticipate eye irritation not related to newly found acquired L eyelid ptosis, unclear cause.  Will try to expedite ophtho referral as patient is leaving Saturday 2 wks out of town.  I don't think there is concomitant trochlear nerve palsy.  If unable to expedite, consider brain MRI.  rec lubricating eye drops for irritation

## 2016-12-25 NOTE — Patient Instructions (Addendum)
For left eye - we will try to get you in with eye doctor tomorrow.  For nail - we will refer you to food doctor when you return.

## 2017-01-15 ENCOUNTER — Other Ambulatory Visit: Payer: Self-pay | Admitting: *Deleted

## 2017-01-15 ENCOUNTER — Other Ambulatory Visit (INDEPENDENT_AMBULATORY_CARE_PROVIDER_SITE_OTHER): Payer: BC Managed Care – PPO

## 2017-01-15 ENCOUNTER — Encounter: Payer: Self-pay | Admitting: Neurology

## 2017-01-15 ENCOUNTER — Ambulatory Visit (INDEPENDENT_AMBULATORY_CARE_PROVIDER_SITE_OTHER): Payer: BC Managed Care – PPO | Admitting: Neurology

## 2017-01-15 VITALS — BP 100/60 | HR 100 | Ht 66.0 in | Wt 156.0 lb

## 2017-01-15 DIAGNOSIS — R202 Paresthesia of skin: Secondary | ICD-10-CM | POA: Diagnosis not present

## 2017-01-15 DIAGNOSIS — R42 Dizziness and giddiness: Secondary | ICD-10-CM

## 2017-01-15 DIAGNOSIS — R252 Cramp and spasm: Secondary | ICD-10-CM

## 2017-01-15 DIAGNOSIS — M62838 Other muscle spasm: Secondary | ICD-10-CM

## 2017-01-15 LAB — CK: Total CK: 40 U/L (ref 7–177)

## 2017-01-15 MED ORDER — DIAZEPAM 5 MG PO TABS: 5.0000 mg | ORAL_TABLET | Freq: Once | ORAL | 0 refills | Status: AC

## 2017-01-15 NOTE — Patient Instructions (Signed)
1.  We will see if we can get an MRI of the brain.  I don't suspect anything serious however.  It may be just subjective dizziness (which is benign or could be due to anxiety).  We can try something to see if it helps.  For muscle twitches, we will check a CK

## 2017-01-15 NOTE — Progress Notes (Signed)
NEUROLOGY CONSULTATION NOTE  Brandi Cherry MRN: [id number] DOB: 1965/08/23  Referring provider: Dr. Danise Mina Primary care provider: Dr. Danise Mina  Reason for consult:  Dizziness, muscle spasms  HISTORY OF PRESENT ILLNESS: Brandi Cherry is a 51 year old female with AV nodal reentry tachycardia s/p ablation, osteoarthritis, migraines, fibromyalgia and anxiety who presents for dizziness, tremor and paresthesias.  History supplemented by PCP notes.  In late December 2017, she had an acute bout of vertigo, described as a spinning sensation that occurred with change in position.  She had BPPV in the past and this was similar.  She was treated with meclizine and it seemed to help.  However, she started having an ongoing vague dizziness.  It is an undulating sensation that is associated with nausea.  She was sent for vestibular rehabilitation which helped but symptoms have persisted.  In March, she developed left ear pain, sinus pressure and sore throat and was diagnosed and treated for acute left otitis media and bacterial sinusitis. The dizziness only occurs when she is walking.  She feels fine when she is sitting.  Walking indoors where she is subjected to bright lights or a shiny floor can trigger it.  Prolonged car rides also exacerbate it.  It doesn't occur every time she walks.    CT of head from 06/29/16 was personally reviewed and was unremarkable. EKG was unremarkable.  She saw ENT, Dr. Erik Obey, in December.  Audiometric testing was unremarkable. She saw opthalmology and needs progressive lenses.    She has history of anxiety and started worrying about her symptoms and had trouble sleeping.  In April 2018, she started experiencing diffuse muscle twitches that involve the face, eyelid, extremities and torso.  She also reports an internal sensation of tremor.  She notes occasional cramps in the calves.  Rarely, she reports paresthesia involving the hands and feet.  She has had increased  anxiety.  She is also currently in menopause.  She was started on a hormone replacement patch, which has helped with anxiety and hot flashes.  The muscle twitches are improved too, but not resolved.  Labs from 10/10/16 include B12 602, folate over 24, TSH 1.30 and Mg 2.2.  She does have a history of migraine without aura.  They are left sided, pounding and associated with nausea, photophobia and phonophobia.  However, they have also been improved since starting the hormone replacement patch.  She has longstanding history of disequilibrium and was always sensitive to car rides since childhood.  She was diagnosed with AV nodal re-entry tachycardia in 2014 and underwent ablation.  She has osteoarthritis.  She saw rheumatology and was diagnosed with fibromyalgia, although she reports no diffuse pain.  PAST MEDICAL HISTORY: Past Medical History:  Diagnosis Date  . Adhesive capsulitis of shoulder 2014   s/p GH steroid injection  . AVNRT (AV nodal re-entry tachycardia) ([place]) 04/2013   s/p ablation (Dr. Teena Dunk at [facility])  . History of miscarriage    G5P2  . History of UTI    tends to get with relations  . 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 Hx of migraines    with cycle  . Melanoma in situ of face [facility])    gets yearly skin checks [name])  . 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 Urine incontinence   . Vertigo     PAST SURGICAL HISTORY: Past Surgical History:  Procedure Laterality Date  . CARDIAC ELECTROPHYSIOLOGY STUDY AND ABLATION  04/2013   AVNRT (EP Dr. Teena Dunk at [facility])  . DILATION AND CURETTAGE OF UTERUS  05/2013   uterine polyp with menorrhagia  . hospitalization  2002   urosepsis, again with son    MEDICATIONS: Current Outpatient Prescriptions on File Prior to Visit  Medication Sig Dispense Refill  . amitriptyline (ELAVIL) 25 MG tablet Take 0.5-1 tablets (12.5-25 mg total) by mouth at bedtime. 30 tablet 1  .  ibuprofen (ADVIL,MOTRIN) 200 MG tablet Take 200 mg by mouth every 6 (six) hours as needed.    Marland Kitchen LORazepam (ATIVAN) 0.5 MG tablet Take 0.5-1 tablets (0.25-0.5 mg total) by mouth at bedtime as needed for anxiety (shaking). 30 tablet 0  . valACYclovir (VALTREX) 500 MG tablet Take 500 mg by mouth daily as needed.      No current facility-administered medications on file prior to visit.     ALLERGIES: No Known Allergies  FAMILY HISTORY: Family History  Problem Relation Age of Onset  . Thyroid disease Mother 16  . Coronary artery disease Father 49       MI  . Hypertension Father   . Cancer Maternal Uncle        unsure  . Mental retardation Maternal Grandmother        bipolar  . Arthritis Other        strong on mother's side, no RA though  . Diabetes Neg Hx     SOCIAL HISTORY: Social History   Social History  . Marital status: Married    Spouse name: N/A  . Number of children: 2  . Years of education: masters   Occupational History  . counselor [employer]   Social History Main Topics  . Smoking status: Never Smoker  . Smokeless tobacco: Never Used  . Alcohol use No  . Drug use: No  . Sexual activity: Not on file   Other Topics Concern  . Not on file   Social History Narrative   Caffeine: occasional tea   Lives with husband, son and daughter   Occupation; Animal nutritionist at [employer]   Edu: Masters   Activity: none currently   Diet: good water, daily fruits/vegetables, red meat 1x/wk, fish 2x/wk    REVIEW OF SYSTEMS: Constitutional: No fevers, chills, or sweats, no generalized fatigue, change in appetite Eyes: No visual changes, double vision, eye pain Ear, nose and throat: No hearing loss, ear pain, nasal congestion, sore throat Cardiovascular: No chest pain, palpitations Respiratory:  No shortness of breath at rest or with exertion, wheezes GastrointestinaI: No nausea, vomiting, diarrhea, abdominal pain, fecal  incontinence Genitourinary:  No dysuria, urinary retention or frequency Musculoskeletal:  Mild neck pain Integumentary: No rash, pruritus, skin lesions Neurological: as above Psychiatric: anxiety Endocrine: No palpitations, fatigue, diaphoresis, mood swings, change in appetite, change in weight, increased thirst Hematologic/Lymphatic:  No purpura, petechiae. Allergic/Immunologic: no itchy/runny eyes, nasal congestion, recent allergic reactions, rashes  PHYSICAL EXAM: Vitals:   01/15/17 1016  BP: 100/60  Pulse: 100   General: No acute distress.  Patient appears well-groomed.  Head:  Normocephalic/atraumatic Eyes:  fundi examined but not visualized Neck: supple, no paraspinal tenderness, full range of motion Back: No paraspinal tenderness Heart: regular rate and rhythm Lungs: Clear to auscultation bilaterally. Vascular: No carotid bruits. Neurological Exam: Mental status: alert and oriented to person, place, and time, recent and remote memory intact, fund of knowledge intact, attention and concentration intact, speech  fluent and not dysarthric, language intact. Cranial nerves: CN I: not tested CN II: pupils equal, round and reactive to light, visual fields intact CN III, IV, VI:  full range of motion, no nystagmus, no ptosis CN V: facial sensation intact CN VII: upper and lower face symmetric CN VIII: hearing intact CN IX, X: gag intact, uvula midline CN XI: sternocleidomastoid and trapezius muscles intact CN XII: tongue midline Bulk & Tone: normal, no fasciculations. Motor:  5/5 throughout  Sensation:  Pinprick and vibration sensation intact. Deep Tendon Reflexes:  2+ throughout, toes downgoing.  Finger to nose testing:  Without dysmetria.  Heel to shin:  Without dysmetria.  Gait:  Normal station and stride.  Able to turn and tandem walk. Romberg negative. Head Impulse Test negative. No fasciculations or abnormal movement was noted but she reportedly saw  it.  IMPRESSION: 1. Dizziness: Suspect subjective dizziness (such as related to anxiety).  Migraine-related dizziness possible, except the dizziness is persistent and her migraines have actually improved.  She runs a low blood pressure, but she reports this is baseline for her. 2.  Muscle twitching.  She reportedly was seeing twitching on my exam, but I could not appreciate it.  Mg and electrolytes okay.   3.  Paresthesia 4.  Migraine without aura  PLAN: 1.  We will try to get an MRI of the brain to evaluate for this persistent dizziness with paresthesia.  She is claustrophobic, so I will prescribe her Valium and she was instructed that she will need a driver. 2.  In addition to twitches, she endorsed cramps so we will check a CK to evaluate for any evidence of muscle disease. 3.  Pending results, we can try an antidepressant to treat the dizziness 4.  Follow up in approximately 3 months.  Thank you for allowing me to take part in the care of this patient.  Metta Clines, DO  CC:  Ria Bush, MD

## 2017-01-16 ENCOUNTER — Telehealth: Payer: Self-pay | Admitting: *Deleted

## 2017-01-16 NOTE — Telephone Encounter (Signed)
-----   Message from Pieter Partridge, DO sent at 01/15/2017  4:25 PM EDT ----- CK is normal

## 2017-01-16 NOTE — Telephone Encounter (Signed)
Patient notified

## 2017-02-02 ENCOUNTER — Ambulatory Visit: Payer: Self-pay | Admitting: Podiatry

## 2017-02-13 ENCOUNTER — Ambulatory Visit (INDEPENDENT_AMBULATORY_CARE_PROVIDER_SITE_OTHER): Payer: BC Managed Care – PPO | Admitting: Podiatry

## 2017-02-13 ENCOUNTER — Encounter: Payer: Self-pay | Admitting: Podiatry

## 2017-02-13 ENCOUNTER — Ambulatory Visit (INDEPENDENT_AMBULATORY_CARE_PROVIDER_SITE_OTHER): Payer: BC Managed Care – PPO

## 2017-02-13 DIAGNOSIS — M25572 Pain in left ankle and joints of left foot: Secondary | ICD-10-CM

## 2017-02-13 DIAGNOSIS — M25571 Pain in right ankle and joints of right foot: Secondary | ICD-10-CM

## 2017-02-13 DIAGNOSIS — M21869 Other specified acquired deformities of unspecified lower leg: Secondary | ICD-10-CM

## 2017-02-13 DIAGNOSIS — L600 Ingrowing nail: Secondary | ICD-10-CM | POA: Diagnosis not present

## 2017-02-13 DIAGNOSIS — M216X9 Other acquired deformities of unspecified foot: Secondary | ICD-10-CM

## 2017-02-13 NOTE — Patient Instructions (Signed)

## 2017-02-13 NOTE — Progress Notes (Signed)
   Subjective:    Patient ID: Brandi Cherry, female    DOB: Jul 25, 1965, 51 y.o.   MRN: [id number]  HPI    Review of Systems  HENT:       Ringing in ears   Cardiovascular: Positive for palpitations.  Musculoskeletal: Positive for arthralgias and myalgias.       Difficulty walking  Skin: Positive for color change.  Neurological: Positive for tremors.  All other systems reviewed and are negative.      Objective:   Physical Exam        Assessment & Plan:

## 2017-02-14 NOTE — Progress Notes (Signed)
Patient ID: Brandi Cherry, female   DOB: [birth year], 51 y.o.   MRN: [id number]   Subjective: Patient presents today for evaluation of pain in toe(s). Patient is concerned for possible ingrown nail. Patient states that the pain has been present for a few weeks now. Patient presents today for further treatment and evaluation.  Objective:  General: Well developed, nourished, in no acute distress, alert and oriented x3   Dermatology: Skin is warm, dry and supple bilateral. Medial border of the left great toe appears to be erythematous with evidence of an ingrowing nail. Pain on palpation noted to the border of the nail fold. The remaining nails appear unremarkable at this time. There are no open sores, lesions.  Vascular: Dorsalis Pedis artery and Posterior Tibial artery pedal pulses palpable. No lower extremity edema noted.   Neruologic: Grossly intact via light touch bilateral.  Musculoskeletal: Muscular strength within normal limits in all groups bilateral. Normal range of motion noted to all pedal and ankle joints. Limited range of motion with dorsiflexion of the ankle joint.  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 Patient evaluated.  2. Discussed treatment alternatives and plan of care. Explained nail avulsion procedure and post procedure course to patient. 3. Patient opted for permanent partial nail avulsion.  4. Prior to procedure, local anesthesia infiltration utilized using 3 ml of a 50:50 mixture of 2% plain lidocaine and 0.5% plain marcaine in a normal hallux block fashion and a betadine prep performed.  5. Partial permanent nail avulsion with chemical matrixectomy performed using [id number] applications of phenol  followed by alcohol flush.  6. Light dressing applied. 7. Today we have discussion regarding the importance of stretching to alleviate symptoms from a gastroc equinus.  8. Return to clinic in 2 weeks.   Patient is looking to move to [facility]. I gave her our neighborhood information  Edrick Kins, DPM [facility]  Dr. Edrick Kins, [address]                                        [place], [place] [address]                Office [phone]  Fax [phone]

## 2017-03-04 ENCOUNTER — Ambulatory Visit (INDEPENDENT_AMBULATORY_CARE_PROVIDER_SITE_OTHER): Payer: BC Managed Care – PPO | Admitting: Podiatry

## 2017-03-04 DIAGNOSIS — M216X9 Other acquired deformities of unspecified foot: Secondary | ICD-10-CM | POA: Diagnosis not present

## 2017-03-04 DIAGNOSIS — M21869 Other specified acquired deformities of unspecified lower leg: Secondary | ICD-10-CM

## 2017-03-04 DIAGNOSIS — L600 Ingrowing nail: Secondary | ICD-10-CM

## 2017-03-04 NOTE — Progress Notes (Signed)
   Subjective: Patient presents today 2 weeks post ingrown nail permanent nail avulsion procedure. Patient states that the toe and nail fold is feeling much better. Patient also presents today for follow-up evaluation treatment regarding gastroc equinus to both lower extremities.  Objective: Skin is warm, dry and supple. Nail and respective nail fold appears to be healing appropriately. Open wound to the associated nail fold with a granular wound base and moderate amount of fibrotic tissue. Minimal drainage noted. Mild erythema around the periungual region likely due to phenol chemical matricectomy. Limited dorsiflexion noted to the ankle joint with a tight gastroc equinus deformity. No rigid end range of motion.  Assessment: #1 postop permanent partial nail avulsion left great toe medial border #2 open wound periungual nail fold of respective digit.  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 Antibiotic ointment and Band-Aid was applied. #3 today we discussed surgical management of gastroc equinus deformity including the gastroc aponeurosis lengthening.  #4 patient is to return to clinic on a PRN  basis.    Edrick Kins, DPM [facility]  Dr. Edrick Kins, [address]                                        [place], [place] [id number]                Office [phone]  Fax [phone]

## 2017-04-03 NOTE — Progress Notes (Signed)
Office Visit Note  Patient: Brandi Cherry             Date of Birth: Apr 01, 1966           MRN: [id number]             PCP: Ria Bush, MD Referring: Ria Bush, MD Visit Date: 04/13/2017 Occupation: @GUAROCC @    Subjective: Generalized pain, hand pain.   History of Present Illness: Brandi Cherry is a 51 y.o. female Returns after her last visit of July 2016. She was initially seen for only 2 visits and was diagnosed with fibromyalgia syndrome, osteoarthritis and vitamin D deficiency. All her autoimmune workup was negative. She states she continues to have arthralgias and myalgias. In January of this year she started experiencing some vertigo symptoms. She had extensive work up by her PCP, by ENT physician and a neurologist which was all unremarkable.she continues to have myalgias and neurology as. Her other concern is that her left index finger does not bend completely. She is left-handed.she denies any joint swelling.  Activities of Daily Living:  Patient reports morning stiffness for 15 minutes.   Patient Denies nocturnal pain.  Difficulty dressing/grooming: Denies Difficulty climbing stairs: Denies Difficulty getting out of chair: Denies Difficulty using hands for taps, buttons, cutlery, and/or writing: Denies   Review of Systems  Constitutional: Positive for fatigue. Negative for night sweats, weight gain, weight loss and weakness.  HENT: Negative for mouth sores, trouble swallowing, trouble swallowing, mouth dryness and nose dryness.   Eyes: Positive for dryness. Negative for pain, redness and visual disturbance.  Respiratory: Negative for cough, shortness of breath and difficulty breathing.   Cardiovascular: Negative for chest pain, palpitations, hypertension, irregular heartbeat and swelling in legs/feet.  Gastrointestinal: Negative for blood in stool, constipation and diarrhea.  Endocrine: Negative for increased urination.  Genitourinary: Negative for vaginal  dryness.  Musculoskeletal: Positive for arthralgias, joint pain, myalgias, morning stiffness and myalgias. Negative for joint swelling, muscle weakness and muscle tenderness.  Skin: Negative for color change, rash, hair loss, skin tightness, ulcers and sensitivity to sunlight.  Allergic/Immunologic: Negative for susceptible to infections.  Neurological: Positive for dizziness. Negative for memory loss and night sweats.  Hematological: Negative for swollen glands.  Psychiatric/Behavioral: Negative for depressed mood and sleep disturbance. The patient is not nervous/anxious.     PMFS History:  Patient Active Problem List   Diagnosis Date Noted  . Ptosis, left eyelid 12/25/2016  . Acquired deformity of toenail 12/25/2016  . Tremor 10/11/2016  . Paresthesia 10/11/2016  . Gastroesophageal reflux disease 08/02/2016  . 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 Vasovagal episode 07/31/2016  . Benign paroxysmal positional vertigo of left ear 07/07/2016  . Enlarged tonsils 01/09/2015  . Dry mouth 12/29/2014  . Heel pain, bilateral 02/23/2014  . 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 Paroxysmal atrial tachycardia ([place]) 07/21/2012  . Lower back pain 11/11/2011  . Hx of migraines   . Vertigo   . HSV-2 (herpes simplex virus 2) infection   . Melanoma in situ of face ([place])   . History of UTI     Past Medical History:  Diagnosis Date  . Adhesive capsulitis of shoulder 2014   s/p GH steroid injection  . AVNRT (AV nodal re-entry tachycardia) ([place]) 04/2013   s/p ablation ([facility] at [facility])  . History of miscarriage    G5P2  . History of UTI  tends to get with relations  . 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 Hx of migraines    with cycle  . Melanoma in situ of face [facility])    gets yearly skin checks [name])  . 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 Urine  incontinence   . Vertigo     Family History  Problem Relation Age of Onset  . Thyroid disease Mother 25  . Coronary artery disease Father 18       MI  . Hypertension Father   . Cancer Maternal Uncle        unsure  . Mental retardation Maternal Grandmother        bipolar  . Arthritis Other        strong on mother's side, no RA though  . Diabetes Neg Hx    Past Surgical History:  Procedure Laterality Date  . CARDIAC ELECTROPHYSIOLOGY STUDY AND ABLATION  04/2013   AVNRT ([facility])  . DILATION AND CURETTAGE OF UTERUS  05/2013   uterine polyp with menorrhagia  . hospitalization  2002   urosepsis, again with son   Social History   Social History Narrative   Caffeine: occasional tea   Lives with husband, son and daughter   Occupation; Animal nutritionist at [employer]   Edu: [employer]   Activity: none currently   Diet: good water, daily fruits/vegetables, red meat 1x/wk, fish 2x/wk     Objective: Vital Signs: BP 110/73 (BP Location: Left Arm, Patient Position: Sitting, Cuff Size: Normal)   Pulse 86   Resp 14   Ht 5' 6.5" (1.689 m)   Wt 160 lb (72.6 kg)   LMP 06/01/2016   BMI 25.44 kg/m    Physical Exam  Constitutional: She is oriented to person, place, and time. She appears well-developed and well-nourished.  HENT:  Head: Normocephalic and atraumatic.  Eyes: Conjunctivae and EOM are normal.  Neck: Normal range of motion.  Cardiovascular: Normal rate, regular rhythm, normal heart sounds and intact distal pulses.   Pulmonary/Chest: Effort normal and breath sounds normal.  Abdominal: Soft. Bowel sounds are normal.  Lymphadenopathy:    She has no cervical adenopathy.  Neurological: She is alert and oriented to person, place, and time.  Skin: Skin is warm and dry. Capillary refill takes less than 2 seconds.  Psychiatric: She has a normal mood and affect. Her behavior is normal.  Nursing note and vitals reviewed.    Musculoskeletal Exam:  C-spine and thoracic lumbar spine good range of motion. Shoulder joints elbow joints wrist joint MCPs PIPs DIPs with good range of motion. She has some thickening of her eft second PIP joint is some soft tissue swelling. No triggering of finger was noted. Hip joints, knee joints, ankles MTPs PIPs with good range of motion with no synovitis.  CDAI Exam: No CDAI exam completed.    Investigation: Findings:  01/02/2015 CBC with diff normal.  CMP with GFR normal.  ACE is normal.  IgG, IgA, IgM are normal.  SPEP M spike is normal, ENA is normal, vitamin D is low at 29.  August 2015.  ANA was negative.  Rheumatoid factor was negative.  Comprehensive metabolic panel, CK, TSH, and C-reactive protein were normal.  Her autoimmune workup so far has been negative in the past    Imaging: Xr Hand 2 View Left  Result Date: 04/13/2017 Minimal  PIP/DIP narrowing was noted.soft tissue swelling was noted in the left second PIP. No MCP or intercarpal joint space narrowing was noted. A cystic change was noted in the left third phalanx. Impression: these findings were consistent with mild osteoarthritic changes. Soft tissue swelling was noted in the left second PIP joint.  Xr Hand 2 View Right  Result Date: 04/13/2017 Minimal PIP/DIP narrowing was noted. No MCP joint narrowing or erosive changes were noted. No intercarpal joint changes were noted. Impression: These findings are consistent with mild osteoarthritis of the hand.   Speciality Comments: No specialty comments available.    Procedures:  Small Joint Inj Date/Time: 04/13/2017 1:25 PM Performed by: Bo Merino Authorized by: Bo Merino   Consent Given by:  Parent and patient Site marked: the procedure site was marked   Timeout: prior to procedure the correct patient, procedure, and site was verified   Indications:  Joint swelling and pain Location:  Ring finger Site:  R ring PIP Needle Size:  27 G Approach:  Dorsal Ultrasound  Guided: Yes   Fluoroscopic Guidance: No   Medications:  0.3 mL lidocaine 1 %; 10 mg triamcinolone acetonide 40 MG/ML Aspiration Attempted: No   Aspirate amount (mL):  0 Patient tolerance:  Patient tolerated the procedure well with no immediate complications   Allergies: Patient has no known allergies.   Assessment / Plan:     Visit Diagnoses: Primary osteoarthritis of both hands - Plan: XR Hand 2 View Right, XR Hand 2 View Left. The x-rays revealed mild osteoarthritic changes. She had left second P IP soft tissue swelling. We discussed the option of cortisone injection which she wanted to proceed with. The procedure is described above.I discussed the option of topical NSAIDs. A prescription for Voltaren gel was given. Side effects were reviewed. A list of natural anti-inflammatories was also given and reviewed.  Adhesive capsulitis of both shoulders: Doing better  Other fatigue: Due to insomnia  Primary insomnia: Good sleep hygiene discussed.  Fibromyalgia: She does experience generalized pain and discomfort. Need for regular exercise was discussed.  Vitamin D deficiency: She's been taking supplement.   Dry mouth : Controlled with over-the-counter products.   Orders: Orders Placed This Encounter  Procedures  . XR Hand 2 View Right  . XR Hand 2 View Left   Meds ordered this encounter  Medications  . diclofenac sodium (VOLTAREN) 1 % GEL    Sig: Apply 3 gm to 3 large joints up to 3 times a day.Dispense 3 tubes with 3 refills.    Dispense:  3 Tube    Refill:  0    Face-to-face time spent with patient was 30 minutes. Greater than50% of time was spent in counseling and coordination of care.  Follow-Up Instructions: Return if symptoms worsen or fail to improve, for Osteoarthritis FMS.   Bo Merino, MD  Note - This record has been created using Editor, commissioning.  Chart creation errors have been sought, but may not always  have been located. Such creation errors do not  reflect on  the standard of medical care.

## 2017-04-10 ENCOUNTER — Telehealth: Payer: Self-pay | Admitting: Family Medicine

## 2017-04-10 NOTE — Telephone Encounter (Signed)
Fine with me

## 2017-04-10 NOTE — Telephone Encounter (Signed)
Pt is moving right down the street from our office and would like to transfer here to Dr Martinique.   Is that ok with you Dr Danise Mina? [place] with you Dr Martinique

## 2017-04-10 NOTE — Telephone Encounter (Signed)
Ok to do. Very nice couple. Thanks.

## 2017-04-13 ENCOUNTER — Ambulatory Visit (INDEPENDENT_AMBULATORY_CARE_PROVIDER_SITE_OTHER): Payer: Self-pay

## 2017-04-13 ENCOUNTER — Ambulatory Visit: Payer: BC Managed Care – PPO | Admitting: Rheumatology

## 2017-04-13 ENCOUNTER — Telehealth: Payer: Self-pay

## 2017-04-13 ENCOUNTER — Encounter: Payer: Self-pay | Admitting: Rheumatology

## 2017-04-13 ENCOUNTER — Ambulatory Visit (INDEPENDENT_AMBULATORY_CARE_PROVIDER_SITE_OTHER): Payer: BC Managed Care – PPO

## 2017-04-13 ENCOUNTER — Ambulatory Visit (INDEPENDENT_AMBULATORY_CARE_PROVIDER_SITE_OTHER): Payer: BC Managed Care – PPO | Admitting: Rheumatology

## 2017-04-13 VITALS — BP 110/73 | HR 86 | Resp 14 | Ht 66.5 in | Wt 160.0 lb

## 2017-04-13 DIAGNOSIS — R5383 Other fatigue: Secondary | ICD-10-CM

## 2017-04-13 DIAGNOSIS — F5101 Primary insomnia: Secondary | ICD-10-CM | POA: Diagnosis not present

## 2017-04-13 DIAGNOSIS — R682 Dry mouth, unspecified: Secondary | ICD-10-CM | POA: Diagnosis not present

## 2017-04-13 DIAGNOSIS — M7501 Adhesive capsulitis of right shoulder: Secondary | ICD-10-CM | POA: Diagnosis not present

## 2017-04-13 DIAGNOSIS — M7502 Adhesive capsulitis of left shoulder: Secondary | ICD-10-CM

## 2017-04-13 DIAGNOSIS — M19041 Primary osteoarthritis, right hand: Secondary | ICD-10-CM | POA: Diagnosis not present

## 2017-04-13 DIAGNOSIS — M19042 Primary osteoarthritis, left hand: Secondary | ICD-10-CM

## 2017-04-13 DIAGNOSIS — E559 Vitamin D deficiency, unspecified: Secondary | ICD-10-CM | POA: Diagnosis not present

## 2017-04-13 DIAGNOSIS — M797 Fibromyalgia: Secondary | ICD-10-CM | POA: Diagnosis not present

## 2017-04-13 MED ORDER — DICLOFENAC SODIUM 1 % TD GEL: TRANSDERMAL | 0 refills | Status: DC

## 2017-04-13 MED ORDER — TRIAMCINOLONE ACETONIDE 40 MG/ML IJ SUSP
10.0000 mg | INTRAMUSCULAR | Status: AC | PRN
  Administered 2017-04-13: 10 mg via INTRA_ARTICULAR

## 2017-04-13 MED ORDER — LIDOCAINE HCL 1 % IJ SOLN
0.3000 mL | INTRAMUSCULAR | Status: AC | PRN
  Administered 2017-04-13: .3 mL

## 2017-04-13 NOTE — Patient Instructions (Signed)
Natural anti-inflammatories  You can purchase these at [employer], [employer] or online.  . Turmeric (capsules)  . Ginger (ginger root or capsules)  . Omega 3 (Fish, flax seeds, chia seeds, walnuts, almonds)  . Tart cherry (dried or extract)   Patient should be under the care of a physician while taking these supplements. This may not be reproduced without the permission of Dr. Lynesha Bango.  

## 2017-04-13 NOTE — Telephone Encounter (Signed)
A prior authorization for Voltaren Gel Prior Authorization was submitted to patient's insurance via cover my meds. Will update once we receive a response.   Brandi Cherry, [place], CPhT 1:34 PM

## 2017-04-15 ENCOUNTER — Telehealth: Payer: Self-pay

## 2017-04-15 DIAGNOSIS — M79641 Pain in right hand: Secondary | ICD-10-CM

## 2017-04-15 NOTE — Telephone Encounter (Addendum)
Received a fax from [facility] regarding a prior authorization DENIAL for DICLOFENAC GEL. Medication is only covered when you are unable to use oral NSAIDs.   Reference number: [place] [id number] Phone number:[phone]  Will send document to scan center.  Called patient to update. Left message.  Somalia Segler, [place], CPhT  12:38 PM

## 2017-04-15 NOTE — Telephone Encounter (Signed)
Patient called stating that she received an injection on Monday in left index finger and she has been having severe pain since Monday afternoon. Currently has a splint on index finger. Would like to know if this is normal.  Cb# [phone].  Please advise.  Thank You.

## 2017-04-16 NOTE — Telephone Encounter (Signed)
Left message for patient to call the office

## 2017-04-22 ENCOUNTER — Ambulatory Visit: Payer: BC Managed Care – PPO | Admitting: Neurology

## 2017-04-23 NOTE — Telephone Encounter (Signed)
Patient states she is still unable to mover her finger. States it is the way it was prior to the injection. Patient would like to know if you would like her to go to see a hand specialist? Please advise.

## 2017-04-23 NOTE — Addendum Note (Signed)
Addended by: Carole Binning on: 04/23/2017 04:38 PM   Modules accepted: Orders

## 2017-04-23 NOTE — Telephone Encounter (Signed)
Yes please refer her to hand surgery.

## 2017-05-14 ENCOUNTER — Ambulatory Visit: Payer: BC Managed Care – PPO | Admitting: Family Medicine

## 2017-05-14 ENCOUNTER — Encounter: Payer: Self-pay | Admitting: Family Medicine

## 2017-05-14 VITALS — BP 110/80 | HR 76 | Temp 97.6°F | Wt 163.4 lb

## 2017-05-14 DIAGNOSIS — H9311 Tinnitus, right ear: Secondary | ICD-10-CM

## 2017-05-14 DIAGNOSIS — R42 Dizziness and giddiness: Secondary | ICD-10-CM

## 2017-05-14 NOTE — Progress Notes (Signed)
Subjective:    Patient ID: Brandi Cherry, female    DOB: [birth year], 51 y.o.   MRN: [id number]  Chief Complaint  Patient presents with  . Ear Pain    HPI Patient was seen today for pain in R ear after pressing on the side of her face.  Pt has ongoing issues with tinnitus and vertigo.  She went to push against the outside of her ear an felt a slight pain.  She thought she may have had some wax build up so she wanted to have it checked.  Pt does not currently have pain.  She endorses tinnitus x 9 wks, sounds "like a tea kettle" going off in R ear.  Pt has been to Neurology, ENT, and PT for this problem, with no relief.  She has an upcoming appt with a Chiropractor, who also does acupuncture, in the hopes of finding relief.   Past Medical History:  Diagnosis Date  . Adhesive capsulitis of shoulder 2014   s/p GH steroid injection  . AVNRT (AV nodal re-entry tachycardia) ([place]) 04/2013   s/p ablation ([facility] at [facility])  . History of miscarriage    G5P2  . History of UTI    tends to get with relations  . 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 Hx of migraines    with cycle  . Melanoma in situ of face [facility])    gets yearly skin checks [name])  . 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 Urine incontinence   . Vertigo     No Known Allergies  ROS General: Denies fever, chills, night sweats, changes in weight, changes in appetite HEENT: Denies headaches, ear pain, changes in vision, rhinorrhea, sore throat  +tinnitus and vertigo CV: Denies CP, palpitations, SOB, orthopnea Pulm: Denies SOB, cough, wheezing GI: Denies abdominal pain, nausea, vomiting, diarrhea, constipation GU: Denies dysuria, hematuria, frequency, vaginal discharge Msk: Denies muscle cramps, joint pains Neuro: Denies weakness, numbness, tingling Skin: Denies rashes, bruising Psych: Denies depression, anxiety, hallucinations     Objective:    Blood pressure  110/80, pulse 76, temperature 97.6 F (36.4 C), temperature source Oral, weight 163 lb 6.4 oz (74.1 kg), last menstrual period 06/01/2016.   Gen. Pleasant, well-nourished, in no distress, normal affect  HEENT: [place]/AT, face symmetric, no scleral icterus, PERRLA, nares patent without drainage, pharynx without erythema or exudate.  Normal appearing TMs b/l. Lungs: no accessory muscle use, CTAB, no wheezes or rales Cardiovascular: RRR, no m/r/g, no peripheral edema Neuro:  A&Ox3, CN II-XII intact, normal gait   Wt Readings from Last 3 Encounters:  05/14/17 163 lb 6.4 oz (74.1 kg)  04/13/17 160 lb (72.6 kg)  01/15/17 156 lb (70.8 kg)    Lab Results  Component Value Date   WBC 9.4 06/29/2016   HGB 12.9 06/29/2016   HCT 38.7 06/29/2016   PLT 194 06/29/2016   GLUCOSE 102 (H) 06/29/2016   ALT 18 06/29/2016   AST 26 06/29/2016   NA 137 06/29/2016   K 4.3 06/29/2016   CL 104 06/29/2016   CREATININE 0.46 06/29/2016   BUN 10 06/29/2016   CO2 25 06/29/2016   TSH 1.30 10/10/2016    Assessment/Plan:  Tinnitus of right ear -chronic -Encouraged to see Chiropractor -f/u with PCP   Vertigo -chronic -f/u with Neuro

## 2017-05-14 NOTE — Patient Instructions (Addendum)
Tinnitus Tinnitus refers to hearing a sound when there is no actual source for that sound. This is often described as ringing in the ears. However, people with this condition may hear a variety of noises. A person may hear the sound in one ear or in both ears. The sounds of tinnitus can be soft, loud, or somewhere in between. Tinnitus can last for a few seconds or can be constant for days. It may go away without treatment and come back at various times. When tinnitus is constant or happens often, it can lead to other problems, such as trouble sleeping and trouble concentrating. Almost everyone experiences tinnitus at some point. Tinnitus that is long-lasting (chronic) or comes back often is a problem that may require medical attention. What are the causes? The cause of tinnitus is often not known. In some cases, it can result from other problems or conditions, including:  Exposure to loud noises from machinery, music, or other sources.  Hearing loss.  Ear or sinus infections.  Earwax buildup.  A foreign object in the ear.  Use of certain medicines.  Use of alcohol and caffeine.  High blood pressure.  Heart diseases.  Anemia.  Allergies.  Meniere disease.  Thyroid problems.  Tumors.  An enlarged part of a weakened blood vessel (aneurysm).  What are the signs or symptoms? The main symptom of tinnitus is hearing a sound when there is no source for that sound. It may sound like:  Buzzing.  Roaring.  Ringing.  Blowing air, similar to the sound heard when you listen to a seashell.  Hissing.  Whistling.  Sizzling.  Humming.  Running water.  A sustained musical note.  How is this diagnosed? Tinnitus is diagnosed based on your symptoms. Your health care provider will do a physical exam. A comprehensive hearing exam (audiologic exam) will be done if your tinnitus:  Affects only one ear (unilateral).  Causes hearing difficulties.  Lasts 6 months or  longer.  You may also need to see a health care provider who specializes in hearing disorders (audiologist). You may be asked to complete a questionnaire to determine the severity of your tinnitus. Tests may be done to help determine the cause and to rule out other conditions. These can include:  Imaging studies of your head and brain, such as: ? A CT scan. ? An MRI.  An imaging study of your blood vessels (angiogram).  How is this treated? Treating an underlying medical condition can sometimes make tinnitus go away. If your tinnitus continues, other treatments may include:  Medicines, such as certain antidepressants or sleeping aids.  Sound generators to mask the tinnitus. These include: ? Tabletop sound machines that play relaxing sounds to help you fall asleep. ? Wearable devices that fit in your ear and play sounds or music. ? A small device that uses headphones to deliver a signal embedded in music (acoustic neural stimulation). In time, this may change the pathways of your brain and make you less sensitive to tinnitus. This device is used for very severe cases when no other treatment is working.  Therapy and counseling to help you manage the stress of living with tinnitus.  Using hearing aids or cochlear implants, if your tinnitus is related to hearing loss.  Follow these instructions at home:  When possible, avoid being in loud places and being exposed to loud sounds.  Wear hearing protection, such as earplugs, when you are exposed to loud noises.  Do not take stimulants, such as nicotine,   alcohol, or caffeine.  Practice techniques for reducing stress, such as meditation, yoga, or deep breathing.  Use a white noise machine, a humidifier, or other devices to mask the sound of tinnitus.  Sleep with your head slightly raised. This may reduce the impact of tinnitus.  Try to get plenty of rest each night. Contact a health care provider if:  You have tinnitus in just one  ear.  Your tinnitus continues for 3 weeks or longer without stopping.  Home care measures are not helping.  You have tinnitus after a head injury.  You have tinnitus along with any of the following: ? Dizziness. ? Loss of balance. ? Nausea and vomiting. This information is not intended to replace advice given to you by your health care provider. Make sure you discuss any questions you have with your health care provider. Document Released: 06/16/2005 Document Revised: 02/17/2016 Document Reviewed: 11/16/2013 Elsevier Interactive Patient Education  2018 [employer]  

## 2017-06-16 ENCOUNTER — Encounter: Payer: Self-pay | Admitting: Family Medicine

## 2017-06-16 ENCOUNTER — Ambulatory Visit: Payer: BC Managed Care – PPO | Admitting: Family Medicine

## 2017-06-16 VITALS — BP 120/78 | HR 80 | Temp 98.2°F | Resp 12 | Ht 66.5 in | Wt 164.5 lb

## 2017-06-16 DIAGNOSIS — L219 Seborrheic dermatitis, unspecified: Secondary | ICD-10-CM

## 2017-06-16 MED ORDER — KETOCONAZOLE 2 % EX CREA: 1.0000 "application " | TOPICAL_CREAM | Freq: Every day | CUTANEOUS | 0 refills | Status: AC

## 2017-06-16 MED ORDER — DESONIDE 0.05 % EX OINT: 1.0000 "application " | TOPICAL_OINTMENT | Freq: Two times a day (BID) | CUTANEOUS | 0 refills | Status: AC

## 2017-06-16 NOTE — Progress Notes (Signed)
ACUTE VISIT   HPI:  Chief Complaint  Patient presents with  . Rash around left eye    started in nose first, spread to eye, dry and itching    [name] is a 51 y.o. female, who is here today complaining of 2 months of left periocular very pruritic rash.  "Really dry and itchy." Initially she noted lesion around nose,right side; which has improved. Seems to be worse at the end of the day. She thinks it is worse with stress. She has not identified alleviating factors.   Chronic scalp pruritus and scaly areas.   She has not noted associated fever,chills,or body aches.  She has not started any new medication, detergent, soap, or body product. No known insect bite or outdoor exposures to plants. No sick contact. No Hx of eczema or similar rash in the past.    OTC medication for this problem: Her son has Hx of eczema, she used cream he uses (?).  She denies oral lesions/edema,cough, wheezing, dyspnea, abdominal pain, nausea, or vomiting.   Review of Systems  Constitutional: Negative for appetite change, chills, fatigue and fever.  HENT: Negative for congestion, facial swelling, mouth sores, nosebleeds, sore throat, trouble swallowing and voice change.   Eyes: Negative for discharge, redness and visual disturbance.  Respiratory: Negative for cough, shortness of breath and wheezing.   Cardiovascular: Negative for leg swelling.  Gastrointestinal: Negative for abdominal pain, diarrhea, nausea and vomiting.  Musculoskeletal: Negative for joint swelling and myalgias.  Skin: Positive for rash. Negative for wound.  Allergic/Immunologic: Negative for environmental allergies.  Neurological: Negative for weakness, numbness and headaches.  Hematological: Negative for adenopathy. Does not bruise/bleed easily.  Psychiatric/Behavioral: Negative for confusion. The patient is nervous/anxious.       Current Outpatient Medications on File Prior to Visit  Medication Sig  Dispense Refill  . CALCIUM PO Take by mouth.    . Cholecalciferol (VITAMIN D PO) Take by mouth.    . COMBIPATCH 0.05-0.14 MG/DAY APPLY 1 PATCH TWICE A WEEK  12  . meclizine (ANTIVERT) 25 MG tablet Take 25 mg by mouth 3 (three) times daily as needed for dizziness.    . valACYclovir (VALTREX) 500 MG tablet Take 500 mg by mouth daily as needed.     Marland Kitchen ibuprofen (ADVIL,MOTRIN) 200 MG tablet Take 200 mg by mouth every 6 (six) hours as needed.     No current facility-administered medications on file prior to visit.      Past Medical History:  Diagnosis Date  . Adhesive capsulitis of shoulder 2014   s/p GH steroid injection  . 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 Teena Cherry at [facility])  . History of miscarriage    G5P2  . History of UTI    tends to get with relations  . 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 Hx of migraines    with cycle  . Melanoma in situ of face [facility])    gets yearly skin checks Brandi Cherry)  . 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 Urine incontinence   . Vertigo    No Known Allergies  Social History   Socioeconomic History  . Marital status: Married    Spouse name: None  . Number of children: 2  . Years of education: masters  . Highest education level: None  Social Needs  . Financial  resource strain: None  . Food insecurity - worry: None  . Food insecurity - inability: None  . Transportation needs - medical: None  . Transportation needs - non-medical: None  Occupational History  . Occupation: Nurse, adult: [employer]  Tobacco Use  . Smoking status: Never Smoker  . Smokeless tobacco: Never Used  Substance and Sexual Activity  . Alcohol use: No    Alcohol/week: 0.0 oz  . Drug use: No  . Sexual activity: None  Other Topics Concern  . None  Social History Narrative   Caffeine: occasional tea   Lives with husband, son and daughter   Occupation;  Animal nutritionist at [employer]   Edu: Masters   Activity: none currently   Diet: good water, daily fruits/vegetables, red meat 1x/wk, fish 2x/wk    Vitals:   06/16/17 0828  BP: 120/78  Pulse: 80  Resp: 12  Temp: 98.2 F (36.8 C)  SpO2: 97%   Body mass index is 26.15 kg/m.   Physical Exam  Nursing note and vitals reviewed. Constitutional: She is oriented to person, place, and time. She appears well-developed and well-nourished. No distress.  HENT:  Head: Normocephalic and atraumatic.    Mouth/Throat: Oropharynx is clear and moist and mucous membranes are normal.  Eyes: Conjunctivae and EOM are normal.  Cardiovascular: Normal rate and regular rhythm.  Respiratory: Effort normal and breath sounds normal. No respiratory distress.  Musculoskeletal: She exhibits no edema.  Lymphadenopathy:       Head (right side): No preauricular and no posterior auricular adenopathy present.       Head (left side): No preauricular and no posterior auricular adenopathy present.    She has no cervical adenopathy.  Neurological: She is alert and oriented to person, place, and time. She has normal strength. No cranial nerve deficit. Gait normal.  Skin: Skin is warm. Rash noted.  Periocular left eye with dry,scaly maculopapular rash. It is not tender,no local heat,or indurated. Small area on right nasal fold. No lesions appreciated on scalp.   Psychiatric: She has a normal mood and affect. Her speech is normal.  Well groomed, good eye contact.    ASSESSMENT AND PLAN:   [name] was seen today for rash around left eye.  Diagnoses and all orders for this visit:  Seborrheic dermatitis -     ketoconazole (NIZORAL) 2 % cream; Apply 1 application topically daily for 21 days. -     desonide (DESOWEN) 0.05 % ointment; Apply 1 application topically 2 (two) times daily for 14 days. For up to 2 weeks.    Other possible etiologies discussed. Educated about Dx and treatment  options. Topical treatment recommended. Side effects of topical steroid treatment, avoid more than 14 days and use small amount instructed. Dermatologist evaluation may be necessary if rash is persistent. Instructed about warning signs. F/U as needed.    -[name] was advised to seek immediate medical attention if sudden worsening symptoms or to follow if they persist or if new concerns arise.     Betty G. Martinique, MD  [facility]. [place] office.

## 2017-06-16 NOTE — Patient Instructions (Addendum)
  A few things to remember from today's visit:   Seborrheic dermatitis - Plan: ketoconazole (NIZORAL) 2 % cream, desonide (DESOWEN) 0.05 % ointment   Please be sure medication list is accurate. If a new problem present, please set up appointment sooner than planned today.          Seborrheic Dermatitis, Adult Seborrheic dermatitis is a skin disease that causes red, scaly patches. It usually occurs on the scalp, and it is often called dandruff. The patches may appear on other parts of the body. Skin patches tend to appear where there are many oil glands in the skin. Areas of the body that are commonly affected include:  Scalp.  Skin folds of the body.  Ears.  Eyebrows.  Neck.  Face.  Armpits.  The bearded area of men's faces.  The condition may come and go for no known reason, and it is often long-lasting (chronic). What are the causes? The cause of this condition is not known. What increases the risk? This condition is more likely to develop in people who:  Have certain conditions, such as: ? HIV (human immunodeficiency virus). ? AIDS (acquired immunodeficiency syndrome). ? Parkinson disease. ? Mood disorders, such as depression.  Are 50-43 years old.  What are the signs or symptoms? Symptoms of this condition include:  Thick scales on the scalp.  Redness on the face or in the armpits.  Skin that is flaky. The flakes may be white or yellow.  Skin that seems oily or dry but is not helped with moisturizers.  Itching or burning in the affected areas.  How is this diagnosed? This condition is diagnosed with a medical history and physical exam. A sample of your skin may be tested (skin biopsy). You may need to see a skin specialist (dermatologist). How is this treated? There is no cure for this condition, but treatment can help to manage the symptoms. You may get treatment to remove scales, lower the risk of skin infection, and reduce swelling or itching.  Treatment may include:  Creams that reduce swelling and irritation (steroids).  Creams that reduce skin yeast.  Medicated shampoo, soaps, moisturizing creams, or ointments.  Medicated moisturizing creams or ointments.  Follow these instructions at home:  Apply over-the-counter and prescription medicines only as told by your health care provider.  Use any medicated shampoo, soaps, skin creams, or ointments only as told by your health care provider.  Keep all follow-up visits as told by your health care provider. This is important. Contact a health care provider if:  Your symptoms do not improve with treatment.  Your symptoms get worse.  You have new symptoms. This information is not intended to replace advice given to you by your health care provider. Make sure you discuss any questions you have with your health care provider. Document Released: 06/16/2005 Document Revised: 01/04/2016 Document Reviewed: 10/04/2015 Elsevier Interactive Patient Education  [employer].

## 2017-07-07 ENCOUNTER — Ambulatory Visit: Payer: BC Managed Care – PPO | Admitting: Family Medicine

## 2017-07-08 ENCOUNTER — Telehealth: Payer: Self-pay | Admitting: Family Medicine

## 2017-07-08 ENCOUNTER — Encounter: Payer: Self-pay | Admitting: Family Medicine

## 2017-07-08 ENCOUNTER — Ambulatory Visit (INDEPENDENT_AMBULATORY_CARE_PROVIDER_SITE_OTHER): Payer: BC Managed Care – PPO | Admitting: Family Medicine

## 2017-07-08 VITALS — BP 118/76 | HR 89 | Temp 98.2°F | Resp 12 | Ht 66.5 in | Wt 165.1 lb

## 2017-07-08 DIAGNOSIS — M25642 Stiffness of left hand, not elsewhere classified: Secondary | ICD-10-CM | POA: Diagnosis not present

## 2017-07-08 DIAGNOSIS — M159 Polyosteoarthritis, unspecified: Secondary | ICD-10-CM | POA: Diagnosis not present

## 2017-07-08 DIAGNOSIS — M542 Cervicalgia: Secondary | ICD-10-CM

## 2017-07-08 DIAGNOSIS — H9311 Tinnitus, right ear: Secondary | ICD-10-CM

## 2017-07-08 DIAGNOSIS — R51 Headache: Secondary | ICD-10-CM

## 2017-07-08 DIAGNOSIS — R519 Headache, unspecified: Secondary | ICD-10-CM

## 2017-07-08 NOTE — Telephone Encounter (Signed)
Copied from [place] [id number]. Topic: General - Other >> Jul 08, 2017  9:58 AM Carolyn Stare wrote:  Pt said Dr Martinique had mention for her to see a ENT for ringing in ears. Pt call to say she would like to go to [facility] to see a ENT  [phone]

## 2017-07-08 NOTE — Telephone Encounter (Signed)
Referral sent to ENT, preference [facility]

## 2017-07-08 NOTE — Patient Instructions (Signed)
A few things to remember from today's visit:   Tinnitus of right ear - Plan: Ambulatory referral to ENT  Limitation of joint motion of finger of left hand - Plan: Ambulatory referral to Orthopedic Surgery  Generalized osteoarthrosis of hand  Headache, unspecified headache type  Tinnitus Tinnitus refers to hearing a sound when there is no actual source for that sound. This is often described as ringing in the ears. However, people with this condition may hear a variety of noises. A person may hear the sound in one ear or in both ears. The sounds of tinnitus can be soft, loud, or somewhere in between. Tinnitus can last for a few seconds or can be constant for days. It may go away without treatment and come back at various times. When tinnitus is constant or happens often, it can lead to other problems, such as trouble sleeping and trouble concentrating. Almost everyone experiences tinnitus at some point. Tinnitus that is long-lasting (chronic) or comes back often is a problem that may require medical attention. What are the causes? The cause of tinnitus is often not known. In some cases, it can result from other problems or conditions, including:  Exposure to loud noises from machinery, music, or other sources.  Hearing loss.  Ear or sinus infections.  Earwax buildup.  A foreign object in the ear.  Use of certain medicines.  Use of alcohol and caffeine.  High blood pressure.  Heart diseases.  Anemia.  Allergies.  Meniere disease.  Thyroid problems.  Tumors.  An enlarged part of a weakened blood vessel (aneurysm).  What are the signs or symptoms? The main symptom of tinnitus is hearing a sound when there is no source for that sound. It may sound like:  Buzzing.  Roaring.  Ringing.  Blowing air, similar to the sound heard when you listen to a seashell.  Hissing.  Whistling.  Sizzling.  Humming.  Running water.  A sustained musical note.  How is this  diagnosed? Tinnitus is diagnosed based on your symptoms. Your health care provider will do a physical exam. A comprehensive hearing exam (audiologic exam) will be done if your tinnitus:  Affects only one ear (unilateral).  Causes hearing difficulties.  Lasts 6 months or longer.  You may also need to see a health care provider who specializes in hearing disorders (audiologist). You may be asked to complete a questionnaire to determine the severity of your tinnitus. Tests may be done to help determine the cause and to rule out other conditions. These can include:  Imaging studies of your head and brain, such as: ? A CT scan. ? An MRI.  An imaging study of your blood vessels (angiogram).  How is this treated? Treating an underlying medical condition can sometimes make tinnitus go away. If your tinnitus continues, other treatments may include:  Medicines, such as certain antidepressants or sleeping aids.  Sound generators to mask the tinnitus. These include: ? Tabletop sound machines that play relaxing sounds to help you fall asleep. ? Wearable devices that fit in your ear and play sounds or music. ? A small device that uses headphones to deliver a signal embedded in music (acoustic neural stimulation). In time, this may change the pathways of your brain and make you less sensitive to tinnitus. This device is used for very severe cases when no other treatment is working.  Therapy and counseling to help you manage the stress of living with tinnitus.  Using hearing aids or cochlear implants, if your  tinnitus is related to hearing loss.  Follow these instructions at home:  When possible, avoid being in loud places and being exposed to loud sounds.  Wear hearing protection, such as earplugs, when you are exposed to loud noises.  Do not take stimulants, such as nicotine, alcohol, or caffeine.  Practice techniques for reducing stress, such as meditation, yoga, or deep breathing.  Use a  white noise machine, a humidifier, or other devices to mask the sound of tinnitus.  Sleep with your head slightly raised. This may reduce the impact of tinnitus.  Try to get plenty of rest each night. Contact a health care provider if:  You have tinnitus in just one ear.  Your tinnitus continues for 3 weeks or longer without stopping.  Home care measures are not helping.  You have tinnitus after a head injury.  You have tinnitus along with any of the following: ? Dizziness. ? Loss of balance. ? Nausea and vomiting. This information is not intended to replace advice given to you by your health care provider. Make sure you discuss any questions you have with your health care provider. Document Released: 06/16/2005 Document Revised: 02/17/2016 Document Reviewed: 11/16/2013 Elsevier Interactive Patient Education  2018 [employer].  Please be sure medication list is accurate. If a new problem present, please set up appointment sooner than planned today.

## 2017-07-08 NOTE — Progress Notes (Signed)
HPI:   Brandi.Brandi Cherry is a 52 y.o. female, who is here today to establish care with me.  I have seen Brandi Cherry in 05/2017 for acute visit.   Former PCP: Dr Danise Mina Last preventive routine visit: within the past year, 08/2016.  Chronic medical problems: Generalized OA,fibromyalgia,tinnitus,vertigo,Hx of migraines (improved after starting hormonal replacement),heart arrhythmia, BCC (follows with derma), and UTI's.  Mentions that she is following with her gyn for abnormal vaginal bleeding,recently underwent endometrial polyp removal.   Concerns today: She has several concerns today,some have been addressed with other providers.   Right ear tinnitus: Started in October 2018, She was evaluated by her dentist and according to pt, dental problem was ruled out as well as TMJ syndrome. She has seen ENT in the past for vertigo, about a year ago.  She still has episodes of dizziness, intermittently, sometimes associated with nausea. In general episodes of dizziness are less frequent. He denies associated hearing loss. She is reporting hearing test done in 08/2016, normal limits.  She reports having a severe episode of vertigo on 12/34/2017, she was taking to the ER via ambulance, otherwise negative workup including head CT.  She has a constant "high-pitched sound in her right ear that is "affecting my life."  No changes in chronic headache, occipital soreness, frequently she wakes up with a headache. She has even tried treatment recommended by her chiropractor in 05/2017, it is not helping. She takes Advil as needed for headaches and joint pain.  History of migraines, greatly improved after she was started on hormonal replacement therapy.  Usually his left retro-ocular, or twice peak sensation", associated photophobia, no nausea or vomiting.  She has about 4-5 episodes per month.  She also mentions "muscle twisting" sensation, generalized, last a few seconds.  She usually  notices it on her face, legs, arms.  Problem has really resolved, lasted about 3 months.  She did not identify exacerbating or alleviating factors.  She is also concerned about sudden onset of limitation of movement of left index finger almost a year ago.  She follows with rheumatology.  She is received a steroid injection, which did not help and she feels like it made problems worse. She has history of osteoarthritis. Left handed.  She follows with rheumatologist,ortho,and neurologist.    Review of Systems  Constitutional: Positive for fatigue. Negative for activity change, appetite change, fever and unexpected weight change.  HENT: Positive for tinnitus. Negative for congestion, dental problem, facial swelling, hearing loss, mouth sores, nosebleeds, sneezing, sore throat and trouble swallowing.   Eyes: Negative for redness and visual disturbance.  Respiratory: Negative for cough, shortness of breath and wheezing.   Cardiovascular: Negative for chest pain, palpitations and leg swelling.  Gastrointestinal: Negative for abdominal pain, nausea and vomiting.       Negative for changes in bowel habits.  Endocrine: Negative for cold intolerance, heat intolerance, polydipsia, polyphagia and polyuria.  Genitourinary: Negative for decreased urine volume, dysuria and hematuria.  Musculoskeletal: Positive for arthralgias. Negative for gait problem.  Skin: Negative for pallor and rash.  Neurological: Positive for dizziness and headaches. Negative for syncope and weakness.  Hematological: Negative for adenopathy. Does not bruise/bleed easily.  Psychiatric/Behavioral: Positive for sleep disturbance. Negative for confusion. The patient is nervous/anxious.       Current Outpatient Medications on File Prior to Visit  Medication Sig Dispense Refill  . CALCIUM PO Take by mouth.    . Cholecalciferol (VITAMIN D PO) Take by mouth.    Marland Kitchen  COMBIPATCH 0.05-0.14 MG/DAY APPLY 1 PATCH TWICE A WEEK  12  .  ibuprofen (ADVIL,MOTRIN) 200 MG tablet Take 200 mg by mouth every 6 (six) hours as needed.    . meclizine (ANTIVERT) 25 MG tablet Take 25 mg by mouth 3 (three) times daily as needed for dizziness.    . valACYclovir (VALTREX) 500 MG tablet Take 500 mg by mouth daily as needed.      No current facility-administered medications on file prior to visit.      Past Medical History:  Diagnosis Date  . Adhesive capsulitis of shoulder 2014   s/p GH steroid injection  . 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 Teena Dunk at [facility])  . History of miscarriage    G5P2  . History of UTI    tends to get with relations  . 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 Hx of migraines    with cycle  . Melanoma in situ of face [facility])    gets yearly skin checks Brandi Cherry)  . 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 Urine incontinence   . Vertigo    No Known Allergies  Family History  Problem Relation Age of Onset  . Thyroid disease Mother 24  . Coronary artery disease Father 48       MI  . Hypertension Father   . Cancer Maternal Uncle        unsure  . Mental retardation Maternal Grandmother        bipolar  . Arthritis Other        strong on mother's side, no RA though  . Asthma Son   . Diabetes Neg Hx     Social History   Socioeconomic History  . Marital status: Married    Spouse name: None  . Number of children: 2  . Years of education: masters  . Highest education level: None  Social Needs  . Financial resource strain: None  . Food insecurity - worry: None  . Food insecurity - inability: None  . Transportation needs - medical: None  . Transportation needs - non-medical: None  Occupational History  . Occupation: Nurse, adult: [employer]  Tobacco Use  . Smoking status: Never Smoker  . Smokeless tobacco: Never Used  Substance and Sexual Activity  . Alcohol use: No    Alcohol/week:  0.0 oz  . Drug use: No  . Sexual activity: None  Other Topics Concern  . None  Social History Narrative   Caffeine: occasional tea   Lives with husband, son and daughter   Occupation; Animal nutritionist at [employer]   Edu: Masters   Activity: none currently   Diet: good water, daily fruits/vegetables, red meat 1x/wk, fish 2x/wk    Vitals:   07/08/17 0709  BP: 118/76  Pulse: 89  Resp: 12  Temp: 98.2 F (36.8 C)  SpO2: 97%    Body mass index is 26.25 kg/m.   Physical Exam  Nursing note and vitals reviewed. Constitutional: She is oriented to person, place, and time. She appears well-developed and well-nourished. No distress.  HENT:  Head: Normocephalic and atraumatic.  Right Ear: Hearing, tympanic membrane, external ear and ear canal normal.  Left Ear: Hearing, tympanic membrane, external ear and ear canal normal.  Mouth/Throat: Oropharynx is clear and moist and mucous membranes are normal.  Eyes: Conjunctivae  and EOM are normal. Pupils are equal, round, and reactive to light.  Neck: No tracheal deviation present. No thyroid mass and no thyromegaly present.  Cardiovascular: Normal rate and regular rhythm.  No murmur heard. Pulses:      Dorsalis pedis pulses are 2+ on the right side, and 2+ on the left side.  Respiratory: Effort normal and breath sounds normal. No respiratory distress.  GI: Soft. She exhibits no mass. There is no hepatomegaly. There is no tenderness.  Musculoskeletal: She exhibits no edema.       Cervical back: She exhibits tenderness.  Tenderness upon palpation of cervical paraspinal muscles and trapezial on the right side. Tenderness over palpation of occipital scalp.  Left index with limitation of flexion of PIP and DIP joint, normal extension. Mild pain elicited with movement.  Heberden's and Bouchard's nodes bilateral. No signs of synovitis.   Lymphadenopathy:       Head (right side): No preauricular and no posterior auricular  adenopathy present.    She has no cervical adenopathy.  Neurological: She is alert and oriented to person, place, and time. She has normal strength. No cranial nerve deficit. Coordination and gait normal.  Skin: Skin is warm. No rash noted. No erythema.  Psychiatric: Her mood appears anxious.  Fairly groomed, good eye contact.      ASSESSMENT AND PLAN:  Brandi. Seferina was seen today for establish care.  Diagnoses and all orders for this visit:  Tinnitus of right ear  We discussed possible etiologies.  + Dizziness, ? Mnire disease Because of new onset on unilateral, I recommend ENT evaluation. Explained that it is usually benign and there is not known treatment, is very chronic. NSAIDs could aggravate problem. Clearly instructed about warning signs.  -     Ambulatory referral to ENT  Limitation of joint motion of finger of left hand  Most likely associated with OA. She would like to see a hand specialist, or the referral placed.  -     Ambulatory referral to Orthopedic Surgery  Generalized osteoarthrosis of hand  Continue following with rheumatologist. Tylenol 500 mg 4 times per day might help.  Headache, unspecified headache type  She still having frequent episodes but in general she feels like problem is a stable. Tension headache. Instructed about warning signs. Continue following with neurologist.  Cervicalgia  Cervical range of motion exercises and massage might help. Continue following with chiropractor.     7:13 AM to 7:51 AM   > 50% was dedicated to discussion of Dx, prognosis, treatment options, and some side effects of medications. Anxious, reassured,most problems discussed today are not new and she is already following with neurologist and rheumatologist. Since she is already following with different providers for her chronic medical problems, I think it is appropriate for me to see her annually, before if needed.      Brandi Samad G. Martinique,  MD  [facility]. [place] office.

## 2017-07-22 ENCOUNTER — Other Ambulatory Visit: Payer: Self-pay | Admitting: Obstetrics and Gynecology

## 2017-07-23 ENCOUNTER — Inpatient Hospital Stay (HOSPITAL_COMMUNITY)
Admission: AD | Admit: 2017-07-23 | Discharge: 2017-07-24 | Disposition: A | Discharge status: Home / Self Care | Payer: BC Managed Care – PPO | Source: Ambulatory Visit | Attending: Obstetrics and Gynecology | Admitting: Obstetrics and Gynecology

## 2017-07-23 DIAGNOSIS — R251 Tremor, unspecified: Secondary | ICD-10-CM

## 2017-07-23 DIAGNOSIS — T8189XA Other complications of procedures, not elsewhere classified, initial encounter: Secondary | ICD-10-CM | POA: Diagnosis not present

## 2017-07-23 DIAGNOSIS — R0789 Other chest pain: Secondary | ICD-10-CM | POA: Diagnosis not present

## 2017-07-23 DIAGNOSIS — R253 Fasciculation: Secondary | ICD-10-CM | POA: Diagnosis present

## 2017-07-23 DIAGNOSIS — N9989 Other postprocedural complications and disorders of genitourinary system: Secondary | ICD-10-CM

## 2017-07-23 LAB — URINALYSIS, ROUTINE W REFLEX MICROSCOPIC
Bilirubin Urine: NEGATIVE
GLUCOSE, UA: NEGATIVE mg/dL
KETONES UR: NEGATIVE mg/dL
Nitrite: NEGATIVE
PH: 5 (ref 5.0–8.0)
Protein, ur: NEGATIVE mg/dL
SPECIFIC GRAVITY, URINE: 1.026 (ref 1.005–1.030)

## 2017-07-23 NOTE — MAU Note (Signed)
Patient had D&C yesterday. Today around 1730 she began having involuntary body twitches which she says feel like "mini seizures." Also has had nausea since after her surgery.  Has scant bleeding from procedure.

## 2017-07-23 NOTE — MAU Provider Note (Signed)
Chief Complaint:  Spasms and Nausea   First Provider Initiated Contact with Patient 07/23/17 2347     HPI: Brandi Cherry is a 52 y.o. No obstetric history on file. who presents to maternity admissions reporting twitching sensations in chest and head since earlier today.  Feels like they might be seizures.  No LOC or change in LOC afterward.  No numbness or tingling.  Had hysteroscopy and D&C yesterday at [place].  She reports vaginal bleeding, but no vaginal itching/burning, urinary symptoms, h/a, dizziness, n/v, or fever/chills.    Has had different symptoms off and on over the past 2 years and has been seen by a neurologist "who did nothing".  States he didn't believe her because she was "a woman in her 35s".  Some symptoms were ringing in ears and tingling sensations in extremities.  Had a cardiac ablation in past at [facility] for aberrant pathway in heart. .    Other  This is a new problem. The current episode started today. The problem occurs intermittently. The problem has been waxing and waning. Pertinent negatives include no abdominal pain, chest pain, chills, congestion, coughing, diaphoresis, fatigue, fever, headaches, nausea, vertigo, visual change or vomiting. Nothing aggravates the symptoms. She has tried nothing for the symptoms.    RN Note: Patient had D&C yesterday. Today around 1730 she began having involuntary body twitches which she says feel like "mini seizures." Also has had nausea since after her surgery.  Has scant bleeding from procedure.      Past Medical History: Past Medical History:  Diagnosis Date  . Adhesive capsulitis of shoulder 2014   s/p GH steroid injection  . 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 Teena Dunk at [facility])  . History of miscarriage    G5P2  . History of UTI    tends to get with relations  . 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 Hx of migraines     with cycle  . Melanoma in situ of face [facility])    gets yearly skin checks [name])  . 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 Urine incontinence   . Vertigo     Past obstetric history: OB History  No data available    Past Surgical History: Past Surgical History:  Procedure Laterality Date  . CARDIAC ELECTROPHYSIOLOGY STUDY AND ABLATION  04/2013   AVNRT (EP Dr. Teena Dunk at [facility])  . DILATION AND CURETTAGE OF UTERUS  05/2013   uterine polyp with menorrhagia  . hospitalization  2002   urosepsis, again with son    Family History: Family History  Problem Relation Age of Onset  . Thyroid disease Mother 53  . Coronary artery disease Father 63       MI  . Hypertension Father   . Cancer Maternal Uncle        unsure  . Mental retardation Maternal Grandmother        bipolar  . Arthritis Other        strong on mother's side, no RA though  . Asthma Son   . Diabetes Neg Hx     Social History: Social History   Tobacco Use  . Smoking status: Never Smoker  . Smokeless tobacco: Never Used  Substance Use Topics  . Alcohol use: No    Alcohol/week: 0.0 oz  . Drug use: No    Allergies: No  Known Allergies  Meds:  Medications Prior to Admission  Medication Sig Dispense Refill Last Dose  . CALCIUM PO Take by mouth.   07/23/2017 at Unknown time  . Cholecalciferol (VITAMIN D PO) Take by mouth.   07/23/2017 at Unknown time  . COMBIPATCH 0.05-0.14 MG/DAY APPLY 1 PATCH TWICE A WEEK  12 Past Week at Unknown time  . ibuprofen (ADVIL,MOTRIN) 200 MG tablet Take 200 mg by mouth every 6 (six) hours as needed.   Past Month at Unknown time  . meclizine (ANTIVERT) 25 MG tablet Take 25 mg by mouth 3 (three) times daily as needed for dizziness.   More than a month at Unknown time  . valACYclovir (VALTREX) 500 MG tablet Take 500 mg by mouth daily as needed.    Unknown at Unknown time    I have reviewed patient's Past Medical Hx, Surgical Hx, Family Hx, Social Hx, medications and  allergies.  ROS:  Review of Systems  Constitutional: Negative for chills, diaphoresis, fatigue and fever.  HENT: Negative for congestion.   Respiratory: Negative for cough.   Cardiovascular: Negative for chest pain.  Gastrointestinal: Negative for abdominal pain, nausea and vomiting.  Neurological: Negative for vertigo and headaches.   Other systems negative     Physical Exam   Patient Vitals for the past 24 hrs:  BP Temp Temp src Pulse Resp Height Weight  07/23/17 2328 (!) 131/94 98.1 F (36.7 C) Oral 83 18 5\' 6"  (1.676 m) 170 lb 0.6 oz (77.1 kg)   Constitutional: Well-developed, well-nourished female in no acute distress.  Cardiovascular: normal rate and rhythm, no ectopy audible, S1 & S2 heard, no murmur Respiratory: normal effort, no distress. Lungs CTAB with no wheezes or crackles GI: Abd soft, non-tender.  Nondistended.  No rebound, No guarding.  Bowel Sounds audible  MS: Extremities nontender, no edema, normal ROM Neurologic: Alert and oriented x 4.   Grossly nonfocal.  No twitching or fasciculations noted on exam.  GU: Neg CVAT. Skin:  Warm and Dry Psych:  Affect appropriate.  PELVIC EXAM: deferred   Labs: Results for orders placed or performed during the hospital encounter of 07/23/17 (from the past 24 hour(s))  Urinalysis, Routine w reflex microscopic     Status: Abnormal   Collection Time: 07/23/17 11:31 PM  Result Value Ref Range   Color, Urine YELLOW YELLOW   APPearance HAZY (A) CLEAR   Specific Gravity, Urine 1.026 1.005 - 1.030   pH 5.0 5.0 - 8.0   Glucose, UA NEGATIVE NEGATIVE mg/dL   Hgb urine dipstick MODERATE (A) NEGATIVE   Bilirubin Urine NEGATIVE NEGATIVE   Ketones, ur NEGATIVE NEGATIVE mg/dL   Protein, ur NEGATIVE NEGATIVE mg/dL   Nitrite NEGATIVE NEGATIVE   Leukocytes, UA TRACE (A) NEGATIVE   RBC / HPF 6-30 0 - 5 RBC/hpf   WBC, UA 6-30 0 - 5 WBC/hpf   Bacteria, UA RARE (A) NONE SEEN   Squamous Epithelial / LPF 6-30 (A) NONE SEEN   Mucus  PRESENT   CBC with Differential/Platelet     Status: Abnormal   Collection Time: 07/24/17 12:11 AM  Result Value Ref Range   WBC 5.5 4.0 - 10.5 K/uL   RBC 4.16 3.87 - 5.11 MIL/uL   Hemoglobin 11.7 (L) 12.0 - 15.0 g/dL   HCT 35.3 (L) 36.0 - 46.0 %   MCV 84.9 78.0 - 100.0 fL   MCH 28.1 26.0 - 34.0 pg   MCHC 33.1 30.0 - 36.0 g/dL   RDW 13.2 11.5 -  15.5 %   Platelets 214 150 - 400 K/uL   Neutrophils Relative % 69 %   Neutro Abs 3.8 1.7 - 7.7 K/uL   Lymphocytes Relative 24 %   Lymphs Abs 1.3 0.7 - 4.0 K/uL   Monocytes Relative 5 %   Monocytes Absolute 0.3 0.1 - 1.0 K/uL   Eosinophils Relative 2 %   Eosinophils Absolute 0.1 0.0 - 0.7 K/uL   Basophils Relative 0 %   Basophils Absolute 0.0 0.0 - 0.1 K/uL  Comprehensive metabolic panel     Status: Abnormal   Collection Time: 07/24/17 12:11 AM  Result Value Ref Range   Sodium 138 135 - 145 mmol/L   Potassium 4.0 3.5 - 5.1 mmol/L   Chloride 104 101 - 111 mmol/L   CO2 25 22 - 32 mmol/L   Glucose, Bld 108 (H) 65 - 99 mg/dL   BUN 15 6 - 20 mg/dL   Creatinine, Ser 0.61 0.44 - 1.00 mg/dL   Calcium 8.5 (L) 8.9 - 10.3 mg/dL   Total Protein 7.3 6.5 - 8.1 g/dL   Albumin 4.0 3.5 - 5.0 g/dL   AST 24 15 - 41 U/L   ALT 20 14 - 54 U/L   Alkaline Phosphatase 58 38 - 126 U/L   Total Bilirubin 0.4 0.3 - 1.2 mg/dL   GFR calc non Af Amer >60 >60 mL/min   GFR calc Af Amer >60 >60 mL/min   Anion gap 9 5 - 15       Imaging:  No results found.  MAU Course/MDM: I have ordered labs as follows:  See above Imaging ordered: none except EKG which showed normal sinus rhythm Results reviewed. Still waiting on ionized calcium  Consult Dr Corinna Capra and CRNA for recommendations. She stated patient likely had LMA anesthesia which would not be associated with these symptoms. She recommended checking electrolytes, EKG, which were both normal  Dr Corinna Capra advised reassurance and having pt contact anesthesiologist in AM to discuss symptoms.   Treatments in MAU included  none.   Pt stable at time of discharge.  Assessment: Status post hysteroscopy and D&C Tremor  Chest discomfort - Plan: Discharge patient  Postoperative surgical complication involving genitourinary system associated with genitourinary procedure, unspecified complication - tremor sensation in chest and head, normal labs, normal EKG - Plan: Discharge patient    Plan: Discharge home Recommend supportive care, good hydration, monitor for changes or worsening of symptoms Follow up with surgery center in [place] Recommend referral to new neurologist for further evaluation  Encouraged to return here or to other Urgent Care/ED if she develops worsening of symptoms, increase in pain, fever, or other concerning symptoms.   Hansel Feinstein CNM, MSN Certified Nurse-Midwife 07/23/2017 11:48 PM

## 2017-07-24 DIAGNOSIS — R251 Tremor, unspecified: Secondary | ICD-10-CM

## 2017-07-24 LAB — CBC WITH DIFFERENTIAL/PLATELET
Basophils Absolute: 0 10*3/uL (ref 0.0–0.1)
Basophils Relative: 0 %
EOS PCT: 2 %
Eosinophils Absolute: 0.1 10*3/uL (ref 0.0–0.7)
HEMATOCRIT: 35.3 % — AB (ref 36.0–46.0)
Hemoglobin: 11.7 g/dL — ABNORMAL LOW (ref 12.0–15.0)
LYMPHS ABS: 1.3 10*3/uL (ref 0.7–4.0)
LYMPHS PCT: 24 %
MCH: 28.1 pg (ref 26.0–34.0)
MCHC: 33.1 g/dL (ref 30.0–36.0)
MCV: 84.9 fL (ref 78.0–100.0)
MONO ABS: 0.3 10*3/uL (ref 0.1–1.0)
MONOS PCT: 5 %
NEUTROS ABS: 3.8 10*3/uL (ref 1.7–7.7)
Neutrophils Relative %: 69 %
PLATELETS: 214 10*3/uL (ref 150–400)
RBC: 4.16 MIL/uL (ref 3.87–5.11)
RDW: 13.2 % (ref 11.5–15.5)
WBC: 5.5 10*3/uL (ref 4.0–10.5)

## 2017-07-24 LAB — COMPREHENSIVE METABOLIC PANEL
ALT: 20 U/L (ref 14–54)
AST: 24 U/L (ref 15–41)
Albumin: 4 g/dL (ref 3.5–5.0)
Alkaline Phosphatase: 58 U/L (ref 38–126)
Anion gap: 9 (ref 5–15)
BILIRUBIN TOTAL: 0.4 mg/dL (ref 0.3–1.2)
BUN: 15 mg/dL (ref 6–20)
CHLORIDE: 104 mmol/L (ref 101–111)
CO2: 25 mmol/L (ref 22–32)
Calcium: 8.5 mg/dL — ABNORMAL LOW (ref 8.9–10.3)
Creatinine, Ser: 0.61 mg/dL (ref 0.44–1.00)
GFR calc non Af Amer: >60 mL/min (ref >60)
Glucose, Bld: 108 mg/dL — ABNORMAL HIGH (ref 65–99)
POTASSIUM: 4 mmol/L (ref 3.5–5.1)
Sodium: 138 mmol/L (ref 135–145)
TOTAL PROTEIN: 7.3 g/dL (ref 6.5–8.1)

## 2017-07-24 NOTE — Discharge Instructions (Signed)
Chest Wall Pain Chest wall pain is pain in or around the bones and muscles of your chest. Sometimes, an injury causes this pain. Sometimes, the cause may not be known. This pain may take several weeks or longer to get better. Follow these instructions at home: Pay attention to any changes in your symptoms. Take these actions to help with your pain:  Rest as told by your health care provider.  Avoid activities that cause pain. These include any activities that use your chest muscles or your abdominal and side muscles to lift heavy items.  If directed, apply ice to the painful area: ? Put ice in a plastic bag. ? Place a towel between your skin and the bag. ? Leave the ice on for 20 minutes, 2-3 times per day.  Take over-the-counter and prescription medicines only as told by your health care provider.  Do not use tobacco products, including cigarettes, chewing tobacco, and e-cigarettes. If you need help quitting, ask your health care provider.  Keep all follow-up visits as told by your health care provider. This is important.  Contact a health care provider if:  You have a fever.  Your chest pain becomes worse.  You have new symptoms. Get help right away if:  You have nausea or vomiting.  You feel sweaty or light-headed.  You have a cough with phlegm (sputum) or you cough up blood.  You develop shortness of breath. This information is not intended to replace advice given to you by your health care provider. Make sure you discuss any questions you have with your health care provider. Document Released: 06/16/2005 Document Revised: 10/25/2015 Document Reviewed: 09/11/2014 Elsevier Interactive Patient Education  2018 [place], Care After These instructions provide you with information about caring for yourself after your procedure. Your health care provider may also give you more specific instructions. Your treatment has been planned according to  current medical practices, but problems sometimes occur. Call your health care provider if you have any problems or questions after your procedure. What can I expect after the procedure? After your procedure, it is common to:  Feel sleepy for several hours.  Feel clumsy and have poor balance for several hours.  Feel forgetful about what happened after the procedure.  Have poor judgment for several hours.  Feel nauseous or vomit.  Have a sore throat if you had a breathing tube during the procedure.  Follow these instructions at home: For at least 24 hours after the procedure:   Do not: ? Participate in activities in which you could fall or become injured. ? Drive. ? Use heavy machinery. ? Drink alcohol. ? Take sleeping pills or medicines that cause drowsiness. ? Make important decisions or sign legal documents. ? Take care of children on your own.  Rest. Eating and drinking  Follow the diet that is recommended by your health care provider.  If you vomit, drink water, juice, or soup when you can drink without vomiting.  Make sure you have little or no nausea before eating solid foods. General instructions  Have a responsible adult stay with you until you are awake and alert.  Take over-the-counter and prescription medicines only as told by your health care provider.  If you smoke, do not smoke without supervision.  Keep all follow-up visits as told by your health care provider. This is important. Contact a health care provider if:  You keep feeling nauseous or you keep vomiting.  You feel light-headed.  You  develop a rash.  You have a fever. Get help right away if:  You have trouble breathing. This information is not intended to replace advice given to you by your health care provider. Make sure you discuss any questions you have with your health care provider. Document Released: 10/07/2015 Document Revised: 02/06/2016 Document Reviewed: 10/07/2015 Elsevier  Interactive Patient Education  [employer].

## 2017-07-25 LAB — CALCIUM, IONIZED: CALCIUM, IONIZED, SERUM: 4.8 mg/dL (ref 4.5–5.6)

## 2017-08-13 IMAGING — CT CT HEAD W/O CM
3 of 4 series · 14 of 47 positions shown, 16 images · non-contrast
Comparison: None.

CLINICAL DATA: Nausea, tingling in fingers bilaterally.  Vertigo.

EXAM:
CT HEAD WITHOUT CONTRAST
TECHNIQUE: Contiguous axial images were obtained from the base of the skull
through the vertex without intravenous contrast.

[Series 2: head w/o · axial · non-contrast · 0.45mm/px · z∈[+1193,+1328]mm · 8 of 33 slices shown, 10 images]
[im 3/33  brain]
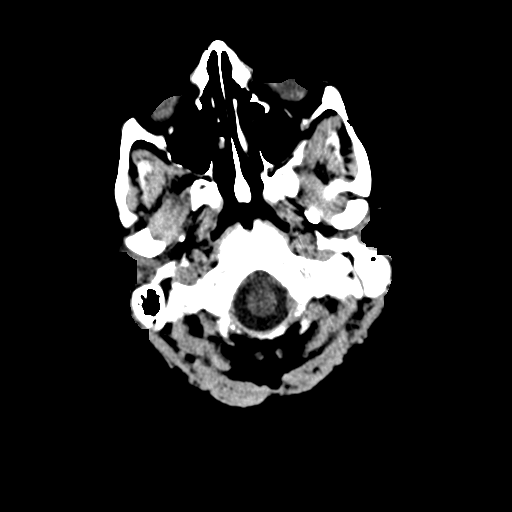
[im 3/33  bone]
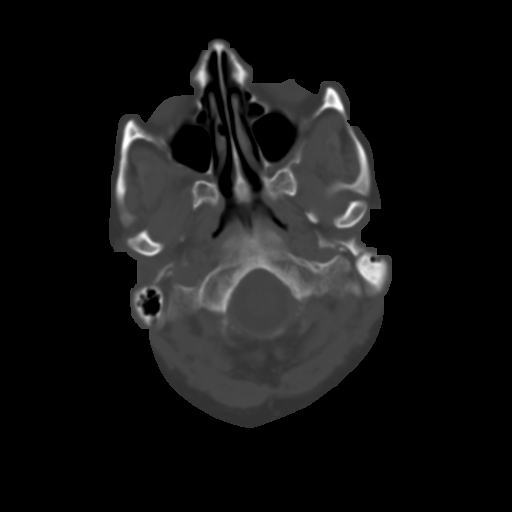
[im 7/33  brain]
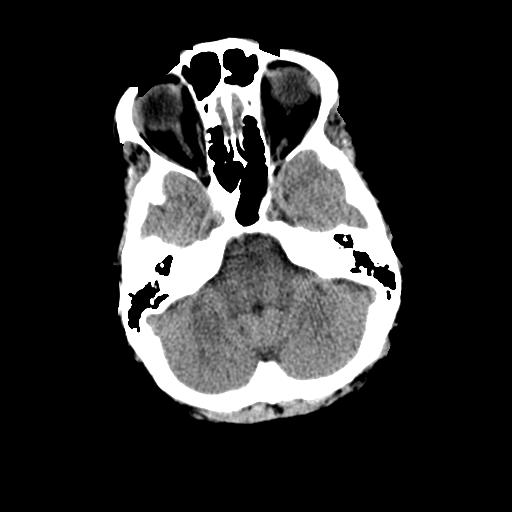
[im 12/33  brain]
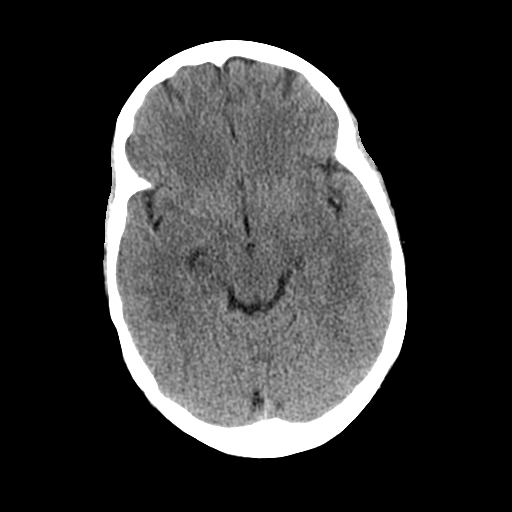
[im 14/33  brain]
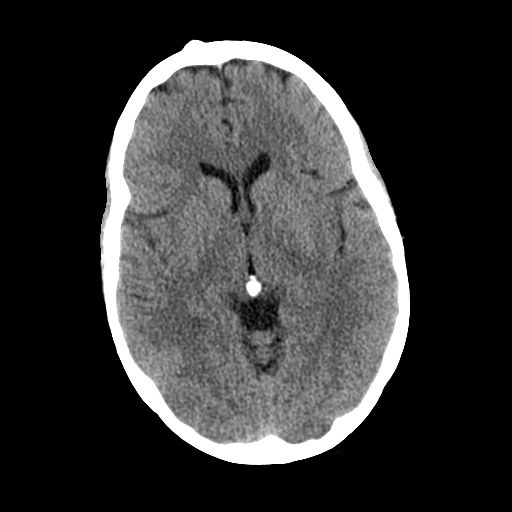
[im 19/33  brain]
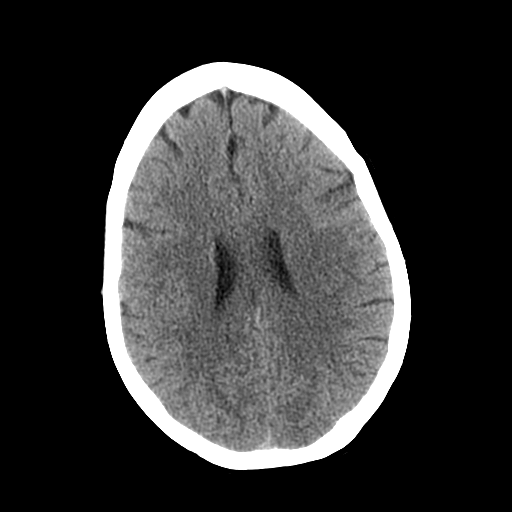
[im 19/33  bone]
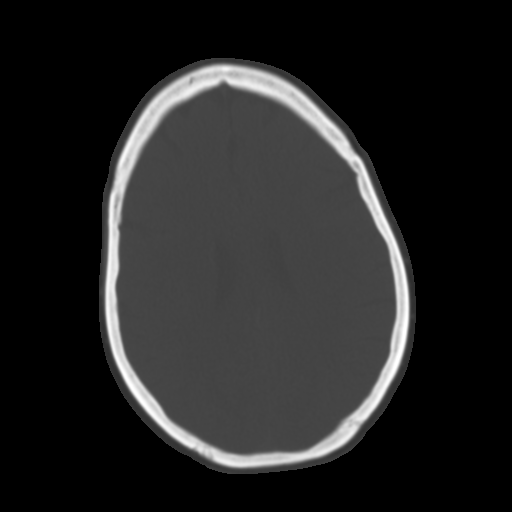
[im 21/33  brain]
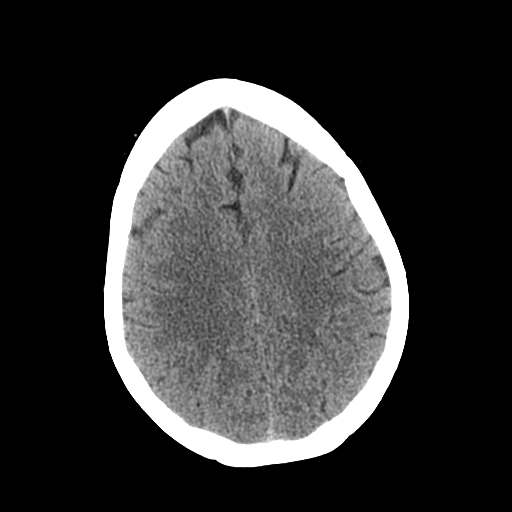
[im 26/33  brain]
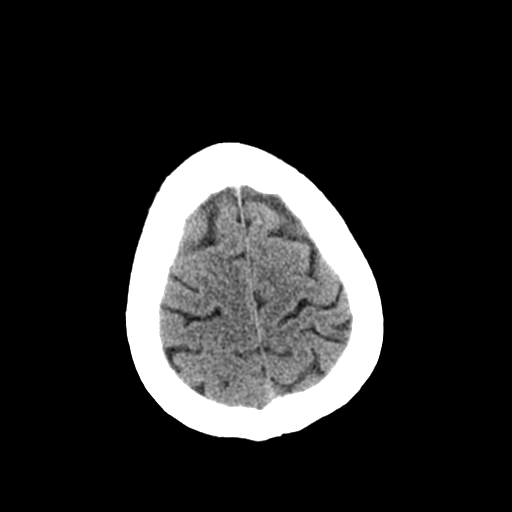
[im 30/33  brain]
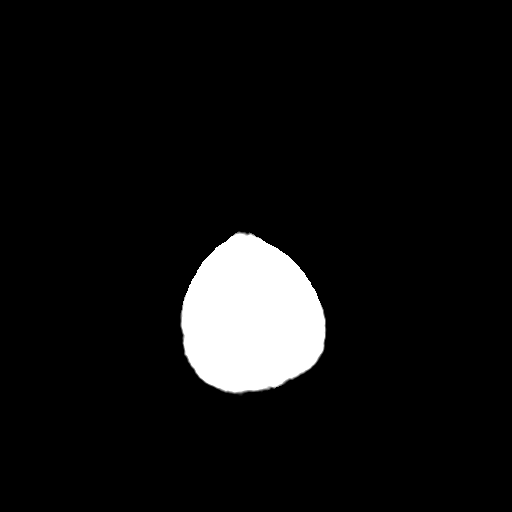

[Series 4: coronal · coronal · 0.31mm/px · 3 of 77 slices shown]
[im 26/77  brain]
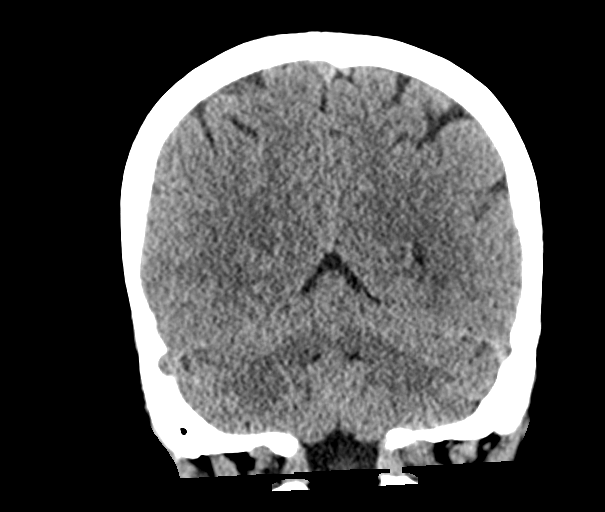
[im 34/77  brain]
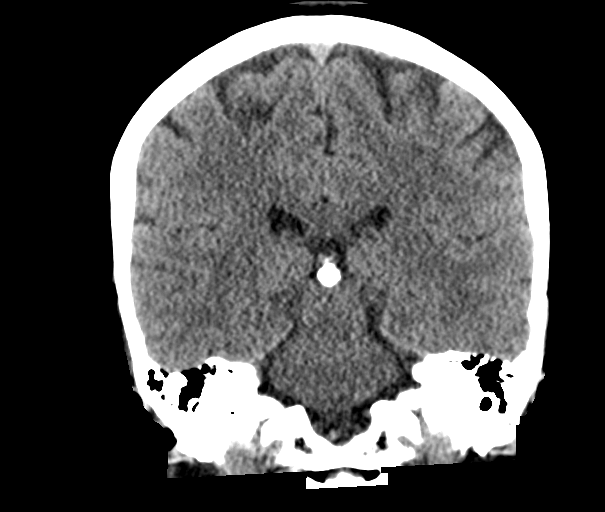
[im 43/77  brain]
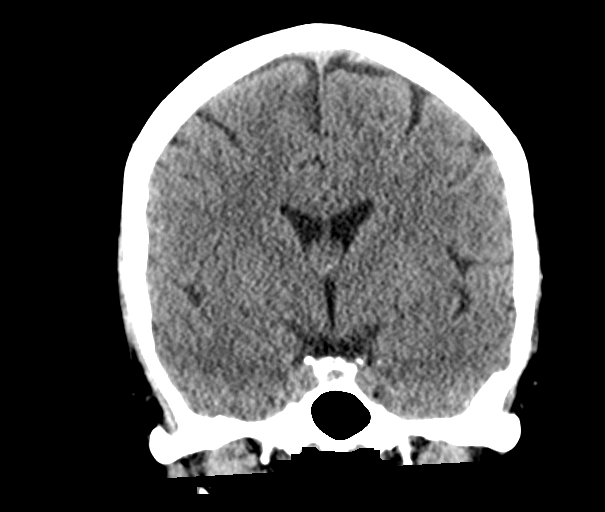

[Series 5: sagittal · sagittal · 0.31mm/px · 3 of 77 slices shown]
[im 26/77  brain]
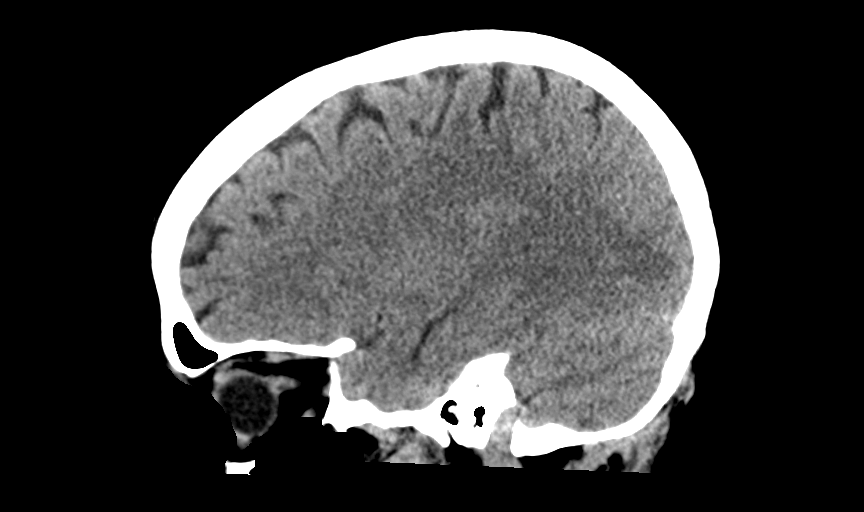
[im 39/77  brain]
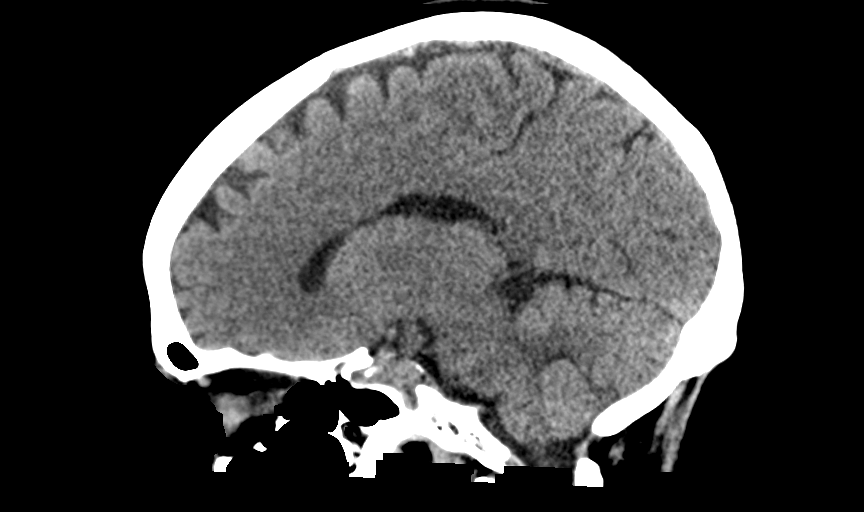
[im 51/77  brain]
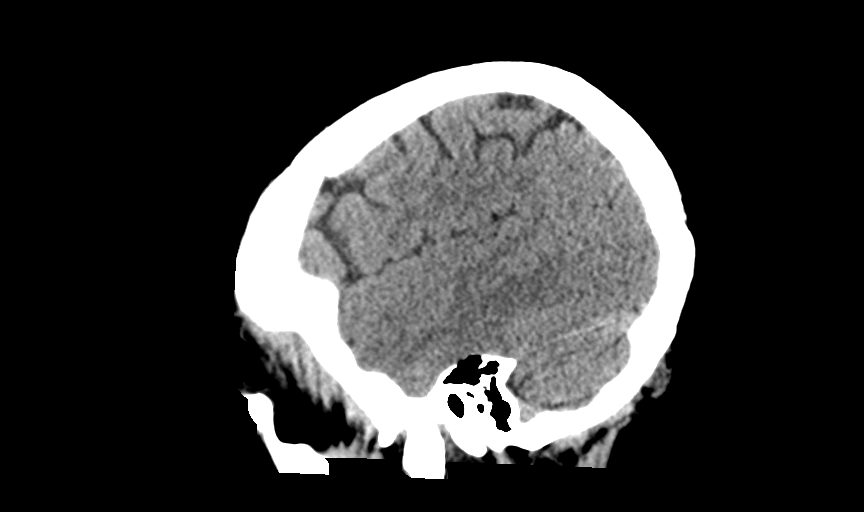

[14 of 47 positions shown; findings below may reference images not displayed]

FINDINGS: Brain: No acute intracranial abnormality. Specifically, no
hemorrhage, hydrocephalus, mass lesion, acute infarction, or
significant intracranial injury.

Vascular: No hyperdense vessel or unexpected calcification.

Skull: No acute calvarial abnormality.

Sinuses/Orbits: Visualized paranasal sinuses and mastoids clear.
Orbital soft tissues unremarkable.

Other: None
IMPRESSION: No intracranial abnormality.

## 2017-08-14 ENCOUNTER — Ambulatory Visit: Payer: BC Managed Care – PPO | Admitting: Diagnostic Neuroimaging

## 2017-10-12 ENCOUNTER — Encounter (INDEPENDENT_AMBULATORY_CARE_PROVIDER_SITE_OTHER): Payer: Self-pay | Admitting: Orthopaedic Surgery

## 2017-10-12 ENCOUNTER — Ambulatory Visit (INDEPENDENT_AMBULATORY_CARE_PROVIDER_SITE_OTHER): Payer: BC Managed Care – PPO | Admitting: Orthopaedic Surgery

## 2017-10-12 DIAGNOSIS — M19042 Primary osteoarthritis, left hand: Secondary | ICD-10-CM | POA: Diagnosis not present

## 2017-10-12 MED ORDER — CELECOXIB 200 MG PO CAPS: 200.0000 mg | ORAL_CAPSULE | Freq: Two times a day (BID) | ORAL | 3 refills | Status: DC

## 2017-10-12 MED ORDER — DICLOFENAC SODIUM 1 % TD GEL: 2.0000 g | Freq: Four times a day (QID) | TRANSDERMAL | Status: DC | PRN

## 2017-10-12 NOTE — Progress Notes (Signed)
Office Visit Note   Patient: Brandi Cherry           Date of Birth: 14-Aug-1965           MRN: [id number] Visit Date: 10/12/2017              Requested by: Martinique, Betty G, MD [address] [place], [place] [address] PCP: Martinique, Betty G, MD   Assessment & Plan: Visit Diagnoses:  1. OA (osteoarthritis) of finger, left     Plan: Impression is left index finger osteoarthritis.  At this point there is nothing that will be alleviated with surgical intervention. we will call in a prescription of Voltaren gel.  We will also send her to hand therapy.  She will follow-up with [place] in 8 weeks time for repeat evaluation.  She will call with concerns or questions in the meantime.  Sample of pennsaid given today.  Total face to face encounter time was greater than 45 minutes and over half of this time was spent in counseling and/or coordination of care.  Follow-Up Instructions: Return in about 8 weeks (around 12/07/2017).   Orders:  No orders of the defined types were placed in this encounter.  Meds ordered this encounter  Medications  . diclofenac sodium (VOLTAREN) 1 % transdermal gel 2 g  . celecoxib (CELEBREX) 200 MG capsule    Sig: Take 1 capsule (200 mg total) by mouth 2 (two) times daily.    Dispense:  30 capsule    Refill:  3      Procedures: No procedures performed   Clinical Data: No additional findings.   Subjective: Chief Complaint  Patient presents with  . Left Hand - Pain    HPI patient is a pleasant 52 year old left-hand-dominant female who presents to our clinic today with pain and stiffness to the index finger of the left hand, specifically the DIP and PIP joints.  This began approximately 1 year ago and has progressively worsened.  Her pain and stiffness is to the DIP and PIP joints.  At one point, she thought she had rheumatoid arthritis and was seen by Dr. Estanislado Pandy.  It was determined that she did not have rheumatoid arthritis but in fact had  osteoarthritis.  Her DIP joint was injected with cortisone approximately 6 months ago which actually made her pain worse.  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  She has not tried any hand therapy.  Of note, she does have a significant family history of Dupuytren's contracture  Review of Systems as detailed in HPI.  All others are negative.   Objective: Vital Signs: LMP 06/01/2016 Comment: started bleeding on Friday 06/12/17  Physical Exam well-developed well-nourished female in no acute distress.  Alert and oriented x3.  Ortho Exam examination of the left hand index finger reveals swelling to the DIP joint.  She does have limited flexion to the DIP and PIP joints.  Moderate tenderness to deep palpation over the DIP joint.  She is stable to valgus and varus stress.  Full strength.  She is neurovascularly intact distally.  Specialty Comments:  No specialty comments available.  Imaging: X-rays reviewed by me and canopy show decreased joint space narrowing DIP and PIP joints   PMFS History: Patient Active Problem List   Diagnosis Date Noted  . OA (osteoarthritis) of finger, left 10/12/2017  . Ptosis, left eyelid 12/25/2016  . Acquired deformity of toenail 12/25/2016  . Tremor  10/11/2016  . Paresthesia 10/11/2016  . Gastroesophageal reflux disease 08/02/2016  . 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 Vasovagal episode 07/31/2016  . Benign paroxysmal positional vertigo of left ear 07/07/2016  . Enlarged tonsils 01/09/2015  . Dry mouth 12/29/2014  . Heel pain, bilateral 02/23/2014  . 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 AVNRT (AV nodal re-entry tachycardia) ([place]) 03/17/2013  . 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 Paroxysmal atrial tachycardia ([place]) 07/21/2012  . Lower back pain 11/11/2011  . Hx of migraines   . Vertigo   . HSV-2 (herpes simplex virus 2) infection   . Melanoma in situ of face ([place])   . History of UTI    Past Medical History:  Diagnosis Date    . Adhesive capsulitis of shoulder 2014   s/p GH steroid injection  . 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 Teena Dunk at [facility])  . History of miscarriage    G5P2  . History of UTI    tends to get with relations  . 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 Hx of migraines    with cycle  . Melanoma in situ of face [facility])    gets yearly skin checks [name])  . 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 Urine incontinence   . Vertigo     Family History  Problem Relation Age of Onset  . Thyroid disease Mother 53  . Coronary artery disease Father 74       MI  . Hypertension Father   . Cancer Maternal Uncle        unsure  . Mental retardation Maternal Grandmother        bipolar  . Arthritis Other        strong on mother's side, no RA though  . Asthma Son   . Diabetes Neg Hx     Past Surgical History:  Procedure Laterality Date  . CARDIAC ELECTROPHYSIOLOGY STUDY AND ABLATION  04/2013   AVNRT (EP Dr. Teena Dunk at [facility])  . DILATION AND CURETTAGE OF UTERUS  05/2013   uterine polyp with menorrhagia  . hospitalization  2002   urosepsis, again with son   Social History   Occupational History  . Occupation: Nurse, adult: [employer]  Tobacco Use  . Smoking status: Never Smoker  . Smokeless tobacco: Never Used  Substance and Sexual Activity  . Alcohol use: No    Alcohol/week: 0.0 oz  . Drug use: No  . Sexual activity: Not on file

## 2017-12-14 ENCOUNTER — Ambulatory Visit (INDEPENDENT_AMBULATORY_CARE_PROVIDER_SITE_OTHER): Payer: BC Managed Care – PPO | Admitting: Orthopaedic Surgery

## 2017-12-15 ENCOUNTER — Ambulatory Visit (INDEPENDENT_AMBULATORY_CARE_PROVIDER_SITE_OTHER): Payer: BC Managed Care – PPO | Admitting: Orthopaedic Surgery

## 2018-03-25 ENCOUNTER — Telehealth: Payer: Self-pay | Admitting: Family Medicine

## 2018-03-25 NOTE — Telephone Encounter (Signed)
Message sent to Dr. Jordan for approval. 

## 2018-03-25 NOTE — Telephone Encounter (Signed)
Copied from [place] [id number]. Topic: Referral - Request >> Mar 25, 2018  3:00 PM Brandi Cherry wrote: Reason for CRM: pt would like a referral to the [facility] neurologist to see Dr Brett Fairy for tremors every where  especially in her hands every 24 hours pt states that she has mention this to Dr Martinique in the past

## 2018-03-29 NOTE — Telephone Encounter (Signed)
She has seen Dr Charlene Brooke before. Does she need a another referral? If she does it is Ok to place referral to continue following with neurologist.  Thanks, BJ

## 2018-03-30 ENCOUNTER — Encounter: Payer: Self-pay | Admitting: Family Medicine

## 2018-03-30 ENCOUNTER — Ambulatory Visit: Payer: BC Managed Care – PPO | Admitting: Family Medicine

## 2018-03-30 VITALS — BP 124/80 | HR 89 | Temp 98.2°F | Resp 12 | Ht 66.0 in | Wt 173.2 lb

## 2018-03-30 DIAGNOSIS — R251 Tremor, unspecified: Secondary | ICD-10-CM | POA: Diagnosis not present

## 2018-03-30 DIAGNOSIS — R202 Paresthesia of skin: Secondary | ICD-10-CM | POA: Diagnosis not present

## 2018-03-30 DIAGNOSIS — R0789 Other chest pain: Secondary | ICD-10-CM

## 2018-03-30 DIAGNOSIS — R253 Fasciculation: Secondary | ICD-10-CM

## 2018-03-30 LAB — CBC
HEMATOCRIT: 38.9 % (ref 36.0–46.0)
Hemoglobin: 13.1 g/dL (ref 12.0–15.0)
MCHC: 33.7 g/dL (ref 30.0–36.0)
MCV: 83.7 fl (ref 78.0–100.0)
Platelets: 244 10*3/uL (ref 150.0–400.0)
RBC: 4.64 Mil/uL (ref 3.87–5.11)
RDW: 14 % (ref 11.5–15.5)
WBC: 4.3 10*3/uL (ref 4.0–10.5)

## 2018-03-30 LAB — COMPREHENSIVE METABOLIC PANEL
ALK PHOS: 60 U/L (ref 39–117)
ALT: 18 U/L (ref 0–35)
AST: 24 U/L (ref 0–37)
Albumin: 4.2 g/dL (ref 3.5–5.2)
BUN: 8 mg/dL (ref 6–23)
CHLORIDE: 104 meq/L (ref 96–112)
CO2: 30 mEq/L (ref 19–32)
Calcium: 8.9 mg/dL (ref 8.4–10.5)
Creatinine, Ser: 0.66 mg/dL (ref 0.40–1.20)
GFR: 100.25 mL/min (ref >60.00)
Glucose, Bld: 101 mg/dL — ABNORMAL HIGH (ref 70–99)
POTASSIUM: 4.3 meq/L (ref 3.5–5.1)
SODIUM: 139 meq/L (ref 135–145)
TOTAL PROTEIN: 6.9 g/dL (ref 6.0–8.3)
Total Bilirubin: 0.4 mg/dL (ref 0.2–1.2)

## 2018-03-30 LAB — TSH: TSH: 1.46 u[IU]/mL (ref 0.35–4.50)

## 2018-03-30 MED ORDER — GABAPENTIN 100 MG PO CAPS: 100.0000 mg | ORAL_CAPSULE | Freq: Three times a day (TID) | ORAL | 3 refills | Status: DC

## 2018-03-30 MED ORDER — GABAPENTIN 100 MG PO CAPS: 300.0000 mg | ORAL_CAPSULE | Freq: Every day | ORAL | 0 refills | Status: DC

## 2018-03-30 NOTE — Progress Notes (Signed)
ACUTE VISIT   HPI:  Chief Complaint  Patient presents with  . Referral for Tremors    Brandi Cherry is a 52 y.o. female, who is here today complaining of recurrent "twitching" muscles and tremor. She has Hx of chronic tinnitus (has seen ENT),vertigo,fibromyalgia, and migraine headaches among some.  C/O 2 years of intermittent muscle "twitching" affecting periocular muscles,lip,chin,chest,and extremities.  It seems to be worse when resting after prolonged activities. She doe snot noted problem much when she is busy. No limitations of daily activities. It interferes with sleep.  She is afraid that chest muscle pain may be cardiac related. She denies radiation to neck or LUE, no associated dyspnea or diaphoresis. Hx of AV nodal re-entry tachycardia. She has not noted palpitations.  She has appt with her cardiologist on 04/09/18.  Hand tremor for 2 years. Symptoms started after severe episode of vertigo. She has not identified exacerbating or alleviating factors. Problem is stable.   Intermittent numbness on feet and hands. It is constant. She has not noted focal deficit.  She has tried Mg.  Lab Results  Component Value Date   TSH 1.30 10/10/2016   Lab Results  Component Value Date   VITAMINB12 602 10/10/2016   She has followed with neuro before,Dr Tomi Likens; she would like to see another neurologist. Brain MRI was ordered but she did not have it done.  She also sees rheumatologist and ortho periodically.    Review of Systems  Constitutional: Positive for fatigue. Negative for activity change, appetite change, fever and unexpected weight change.  HENT: Positive for tinnitus. Negative for mouth sores, nosebleeds, sore throat and trouble swallowing.   Eyes: Negative for redness and visual disturbance.  Respiratory: Negative for cough, shortness of breath and wheezing.   Cardiovascular: Negative for palpitations and leg swelling.  Gastrointestinal:  Negative for abdominal pain, nausea and vomiting.       Negative for changes in bowel habits.  Endocrine: Negative for cold intolerance and heat intolerance.  Genitourinary: Negative for decreased urine volume, dysuria and hematuria.  Musculoskeletal: Positive for arthralgias and myalgias. Negative for gait problem.  Skin: Negative for color change and rash.  Neurological: Positive for tremors and numbness. Negative for seizures, syncope, weakness and headaches.  Hematological: Negative for adenopathy. Does not bruise/bleed easily.  Psychiatric/Behavioral: Positive for sleep disturbance. Negative for confusion. The patient is nervous/anxious.       Current Outpatient Medications on File Prior to Visit  Medication Sig Dispense Refill  . CALCIUM PO Take by mouth.    . Cholecalciferol (VITAMIN D PO) Take by mouth.    . COMBIPATCH 0.05-0.14 MG/DAY APPLY 1 PATCH TWICE A WEEK  12  . meclizine (ANTIVERT) 25 MG tablet Take 25 mg by mouth 3 (three) times daily as needed for dizziness.    . Melatonin 3 MG TABS Take 1 tablet by mouth at bedtime.    . Multiple Vitamin (MULTIVITAMIN) tablet Take 1 tablet by mouth daily.    . valACYclovir (VALTREX) 500 MG tablet Take 500 mg by mouth daily as needed.      Current Facility-Administered Medications on File Prior to Visit  Medication Dose Route Frequency Provider Last Rate Last Dose  . diclofenac sodium (VOLTAREN) 1 % transdermal gel 2 g  2 g Topical QID PRN Nathaniel Man         Past Medical History:  Diagnosis Date  . Adhesive capsulitis of shoulder 2014   s/p GH steroid injection  .  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 Teena Dunk at [facility])  . History of miscarriage    G5P2  . History of UTI    tends to get with relations  . 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 Hx of migraines    with cycle  . Melanoma in situ of face [facility])    gets yearly  skin checks [name])  . 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 Urine incontinence   . Vertigo    No Known Allergies  Social History   Socioeconomic History  . Marital status: Married    Spouse name: Not on file  . Number of children: 2  . Years of education: masters  . Highest education level: Not on file  Occupational History  . Occupation: Nurse, adult: [employer]  . Financial resource strain: Not on file  . Food insecurity:    Worry: Not on file    Inability: Not on file  . Transportation needs:    Medical: Not on file    Non-medical: Not on file  Tobacco Use  . Smoking status: Never Smoker  . Smokeless tobacco: Never Used  Substance and Sexual Activity  . Alcohol use: No    Alcohol/week: 0.0 standard drinks  . Drug use: No  . Sexual activity: Not on file  Lifestyle  . Physical activity:    Days per week: Not on file    Minutes per session: Not on file  . Stress: Not on file  Relationships  . Social connections:    Talks on phone: Not on file    Gets together: Not on file    Attends religious service: Not on file    Active member of club or organization: Not on file    Attends meetings of clubs or organizations: Not on file    Relationship status: Not on file  Other Topics Concern  . Not on file  Social History Narrative   Caffeine: occasional tea   Lives with husband, son and daughter   Occupation; Animal nutritionist at [employer]   Edu: Masters   Activity: none currently   Diet: good water, daily fruits/vegetables, red meat 1x/wk, fish 2x/wk    Vitals:   03/30/18 0927  BP: 124/80  Pulse: 89  Resp: 12  Temp: 98.2 F (36.8 C)  SpO2: 98%   Body mass index is 27.96 kg/m.   Physical Exam  Nursing note and vitals reviewed. Constitutional: She is oriented to person, place, and time. She appears well-developed and well-nourished. No distress.  HENT:  Head: Normocephalic and atraumatic.    Mouth/Throat: Oropharynx is clear and moist and mucous membranes are normal.  Eyes: Pupils are equal, round, and reactive to light. Conjunctivae are normal.  Cardiovascular: Normal rate and regular rhythm.  No murmur heard. Pulses:      Dorsalis pedis pulses are 2+ on the right side, and 2+ on the left side.  Respiratory: Effort normal and breath sounds normal. No respiratory distress. She exhibits tenderness.  GI: Soft. She exhibits no mass. There is no hepatomegaly. There is no tenderness.  Musculoskeletal: She exhibits no edema.       Cervical back: She exhibits no tenderness and no bony tenderness.       Thoracic back: She exhibits no tenderness and no bony tenderness.  No  signs of synovitis.  Lymphadenopathy:    She has no cervical adenopathy.  Neurological: She is alert and oriented to person, place, and time. She has normal strength. She displays tremor (Hand,bilateral. Not noted with rest.). No cranial nerve deficit. Gait normal.  Skin: Skin is warm. No rash noted. No erythema.  Psychiatric: Her mood appears anxious.  Well groomed, good eye contact.      ASSESSMENT AND PLAN:  Brandi Cherry was seen today for referral for tremors.  Diagnoses and all orders for this visit:  Lab Results  Component Value Date   TSH 1.46 03/30/2018   Lab Results  Component Value Date   CREATININE 0.66 03/30/2018   BUN 8 03/30/2018   NA 139 03/30/2018   K 4.3 03/30/2018   CL 104 03/30/2018   CO2 30 03/30/2018   Lab Results  Component Value Date   WBC 4.3 03/30/2018   HGB 13.1 03/30/2018   HCT 38.9 03/30/2018   MCV 83.7 03/30/2018   PLT 244.0 03/30/2018   Lab Results  Component Value Date   ALT 18 03/30/2018   AST 24 03/30/2018   ALKPHOS 60 03/30/2018   BILITOT 0.4 03/30/2018     Tremor  ? Essential tremor,anxiety among some to consider. Other possible etiologies discussed. Problem seems to be otherwise stable. Neuro referral placed as requested.  -     Ambulatory  referral to Neurology -     Comprehensive metabolic panel -     TSH -     CBC  Muscle fasciculation  Gabapentin may help,some side effects discussed. Gabapentin 100 mg ,intructed to titrate dose up to 300 mg as tolerated and at bedtime. She will continue following with neuro.  -     gabapentin (NEURONTIN) 100 MG capsule; Take 3 capsules (300 mg total) by mouth at bedtime.  Chest wall discomfort  It does not seem to be cardiac but rather musculoskeletal. It could be related to fibromyalgia. She has appt with cardiologist. Instructed about warning signs.  Paresthesia  Chronic. She has had some work-up, she would like to have some labs repeated. We discussed a few treatment options,she agrees with trying Gabapentin,which may also help with sleep. Neuro referral placed.  -     Ambulatory referral to Neurology -     Comprehensive metabolic panel -     TSH -     CBC -     gabapentin (NEURONTIN) 100 MG capsule; Take 3 capsules (300 mg total) by mouth at bedtime.    Return if symptoms worsen or fail to improve.       Marti Acebo G. Martinique, MD  [facility]. [place] office.

## 2018-03-30 NOTE — Telephone Encounter (Signed)
Patient came in for office visit to discuss referral.

## 2018-03-30 NOTE — Patient Instructions (Addendum)
A few things to remember from today's visit:   Tremor - Plan: Ambulatory referral to Neurology, Comprehensive metabolic panel, TSH, CBC  Muscle fasciculation - Plan: gabapentin (NEURONTIN) 100 MG capsule, DISCONTINUED: gabapentin (NEURONTIN) 100 MG capsule  Gabapentin 100 mg at bedtime, increased to 2 caps in a week and 3 caps in a not her week. Goal is Gabapentin 300 mg at bedtime.  Please be sure medication list is accurate. If a new problem present, please set up appointment sooner than planned today.

## 2018-03-31 ENCOUNTER — Encounter: Payer: Self-pay | Admitting: Family Medicine

## 2018-04-03 ENCOUNTER — Encounter: Payer: Self-pay | Admitting: Family Medicine

## 2018-05-17 ENCOUNTER — Ambulatory Visit: Payer: BC Managed Care – PPO | Admitting: Family Medicine

## 2018-05-17 ENCOUNTER — Encounter: Payer: Self-pay | Admitting: Family Medicine

## 2018-05-17 VITALS — BP 100/60 | HR 81 | Temp 97.6°F | Resp 12 | Ht 66.5 in | Wt 171.6 lb

## 2018-05-17 DIAGNOSIS — R51 Headache: Secondary | ICD-10-CM | POA: Diagnosis not present

## 2018-05-17 DIAGNOSIS — H8112 Benign paroxysmal vertigo, left ear: Secondary | ICD-10-CM

## 2018-05-17 DIAGNOSIS — R110 Nausea: Secondary | ICD-10-CM | POA: Diagnosis not present

## 2018-05-17 DIAGNOSIS — R519 Headache, unspecified: Secondary | ICD-10-CM

## 2018-05-17 MED ORDER — MECLIZINE HCL 25 MG PO TABS: 25.0000 mg | ORAL_TABLET | Freq: Three times a day (TID) | ORAL | 2 refills | Status: DC | PRN

## 2018-05-17 MED ORDER — ONDANSETRON HCL 4 MG PO TABS: 4.0000 mg | ORAL_TABLET | Freq: Every day | ORAL | 0 refills | Status: DC | PRN

## 2018-05-17 NOTE — Patient Instructions (Addendum)
A few things to remember from today's visit:   Benign paroxysmal positional vertigo of left ear - Plan: meclizine (ANTIVERT) 25 MG tablet   Dizziness is a perception of movement, it is sometimes difficult to describe and can be  caused by different problems, most benign but others can be life threaten.  Vertigo is the most common cause of dizziness, usually related with inner ear and can be associated with nausea, vomiting, and unbalance sensation. It can be complicated by falls due to lose of balance; so fall precautions are very important.  Most of the time dizziness is benign, usually intermittent, last a few seconds at the time and aggravated by certain positions. It usually resolves in a few weeks without residual effect but it could be recurrent.  Sometimes blood work is ordered to evaluate for other possible causes.  Dizziness can also be caused by certain medications, dehydration, migraines, and strokes.  Medication prescribed for vertigo, Meclizine, causes drowsiness/sleepiness, so frequently I recommended taking it at bedtime. I also recommend what we called vestibular exercise, sometimes can be done at home (Modified Semont maneuvers) other times I refer patients to vestibular rehabilitation.   Seek immediate medical attention if: New severe headache, dobble vision, fever (100 F or more), associated numbness/tingling, focal weakness, persistent vomiting, not able to walk, or sudden worsening symptoms.    Vertigo Vertigo means that you feel like you are moving when you are not. Vertigo can also make you feel like things around you are moving when they are not. This feeling can come and go at any time. Vertigo often goes away on its own. Follow these instructions at home:  Avoid making fast movements.  Avoid driving.  Avoid using heavy machinery.  Avoid doing any task or activity that might cause danger to you or other people if you would have a vertigo attack while you  are doing it.  Sit down right away if you feel dizzy or have trouble with your balance.  Take over-the-counter and prescription medicines only as told by your doctor.  Follow instructions from your doctor about which positions or movements you should avoid.  Drink enough fluid to keep your pee (urine) clear or pale yellow.  Keep all follow-up visits as told by your doctor. This is important. Contact a doctor if:  Medicine does not help your vertigo.  You have a fever.  Your problems get worse or you have new symptoms.  Your family or friends see changes in your behavior.  You feel sick to your stomach (nauseous) or you throw up (vomit).  You have a "pins and needles" feeling or you are numb in part of your body. Get help right away if:  You have trouble moving or talking.  You are always dizzy.  You pass out (faint).  You get very bad headaches.  You feel weak or have trouble using your hands, arms, or legs.  You have changes in your hearing.  You have changes in your seeing (vision).  You get a stiff neck.  Bright light starts to bother you. This information is not intended to replace advice given to you by your health care provider. Make sure you discuss any questions you have with your health care provider. Document Released: 03/25/2008 Document Revised: 11/22/2015 Document Reviewed: 10/09/2014 Elsevier Interactive Patient Education  [employer].  Please be sure medication list is accurate. If a new problem present, please set up appointment sooner than planned today.

## 2018-05-17 NOTE — Progress Notes (Addendum)
ACUTE VISIT   HPI:  Chief Complaint  Patient presents with  . Dizziness    Pt present for vertigo.    Ms.Lissette Rukaya Kleinschmidt is a 52 y.o. female, who is here today complaining of episodes of dizziness for the past 3 days,spinning sensation and feeling like she is walking on a cloud, feels "unsteady." She has  Has not had a fall. .+ Nausea,aggravated by oral intake. No vomiting,abdominal pain,or vomiting.   Hx of vertigo. She takes Meclizine as needed,took one 1-2 days ago,today  She has followed with ENT.  Episodes last about 5 seconds.  It is exacerbated by lying on her back,alleviated by sitting up.She has been sleeping on a couch,sitting position. Denies changes in hearing.  + Tinnitus,stable.  Also c/o "bad" neck pain and headache, sore like pain on neck and occipital area. She wakes up with her headache. She has seen chiropractor for cervical pain. Problem is stable. No associated visual changes or focal deficit. This is not related to dizziness. Hx of migraines.  She has an appt with neurologist  06/27/18.  She is also following with cardiologist,wearing a 30 days heart monitor.  Review of Systems  Constitutional: Negative for appetite change, fatigue and fever.  HENT: Positive for tinnitus. Negative for congestion, ear discharge, ear pain, hearing loss, rhinorrhea, sore throat and voice change.   Eyes: Negative for photophobia and visual disturbance.  Respiratory: Negative for cough, shortness of breath and wheezing.   Cardiovascular: Negative for chest pain, palpitations and leg swelling.  Gastrointestinal: Positive for nausea. Negative for abdominal pain and vomiting.       No changes in bowel habits.  Musculoskeletal: Positive for neck pain. Negative for myalgias.  Skin: Negative for color change and rash.  Neurological: Positive for dizziness and headaches. Negative for syncope, facial asymmetry, speech difficulty and weakness.    Psychiatric/Behavioral: Negative for confusion and sleep disturbance. The patient is nervous/anxious.       Current Outpatient Medications on File Prior to Visit  Medication Sig Dispense Refill  . CALCIUM PO Take by mouth.    . Cholecalciferol (VITAMIN D PO) Take by mouth.    . COMBIPATCH 0.05-0.14 MG/DAY APPLY 1 PATCH TWICE A WEEK  12  . Melatonin 3 MG TABS Take 1 tablet by mouth at bedtime.    . Multiple Vitamin (MULTIVITAMIN) tablet Take 1 tablet by mouth daily.    . valACYclovir (VALTREX) 500 MG tablet Take 500 mg by mouth daily as needed.     . gabapentin (NEURONTIN) 100 MG capsule Take 3 capsules (300 mg total) by mouth at bedtime. (Patient not taking: Reported on 05/17/2018) 90 capsule 0   Current Facility-Administered Medications on File Prior to Visit  Medication Dose Route Frequency Provider Last Rate Last Dose  . diclofenac sodium (VOLTAREN) 1 % transdermal gel 2 g  2 g Topical QID PRN Nathaniel Man         Past Medical History:  Diagnosis Date  . Adhesive capsulitis of shoulder 2014   s/p GH steroid injection  . 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)  . History of miscarriage    G5P2  . History of UTI    tends to get with relations  . 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 Hx of migraines    with cycle  . Melanoma  in situ of face [facility])    gets yearly skin checks [name])  . 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 Urine incontinence   . Vertigo    No Known Allergies  Social History   Socioeconomic History  . Marital status: Married    Spouse name: Not on file  . Number of children: 2  . Years of education: masters  . Highest education level: Not on file  Occupational History  . Occupation: Nurse, adult: [employer]  . Financial resource strain: Not on file  . Food insecurity:    Worry: Not on file     Inability: Not on file  . Transportation needs:    Medical: Not on file    Non-medical: Not on file  Tobacco Use  . Smoking status: Never Smoker  . Smokeless tobacco: Never Used  Substance and Sexual Activity  . Alcohol use: No    Alcohol/week: 0.0 standard drinks  . Drug use: No  . Sexual activity: Not on file  Lifestyle  . Physical activity:    Days per week: Not on file    Minutes per session: Not on file  . Stress: Not on file  Relationships  . Social connections:    Talks on phone: Not on file    Gets together: Not on file    Attends religious service: Not on file    Active member of club or organization: Not on file    Attends meetings of clubs or organizations: Not on file    Relationship status: Not on file  Other Topics Concern  . Not on file  Social History Narrative   Caffeine: occasional tea   Lives with husband, son and daughter   Occupation; Animal nutritionist at [employer]   Edu: Masters   Activity: none currently   Diet: good water, daily fruits/vegetables, red meat 1x/wk, fish 2x/wk    Vitals:   05/17/18 0938  BP: 100/60  Pulse: 81  Resp: 12  Temp: 97.6 F (36.4 C)  SpO2: 98%   Body mass index is 27.28 kg/m.   Physical Exam  Nursing note and vitals reviewed. Constitutional: She is oriented to person, place, and time. She appears well-developed. She does not appear ill. No distress.  HENT:  Head: Normocephalic and atraumatic.  Right Ear: Hearing, tympanic membrane, external ear and ear canal normal.  Left Ear: Hearing, tympanic membrane, external ear and ear canal normal.  Mouth/Throat: Oropharynx is clear and moist and mucous membranes are normal.  Apley maneuver negative Nystagmus negative.   Eyes: Pupils are equal, round, and reactive to light. Conjunctivae are normal.  Cardiovascular: Normal rate and regular rhythm.  No murmur heard. Respiratory: Effort normal and breath sounds normal. No respiratory distress.    Musculoskeletal: She exhibits no edema or tenderness.  Lymphadenopathy:    She has no cervical adenopathy.  Neurological: She is alert and oriented to person, place, and time. She has normal strength. No cranial nerve deficit. She displays a negative Romberg sign. Coordination and gait normal.  Reflex Scores:      Bicep reflexes are 2+ on the right side and 2+ on the left side.      Patellar reflexes are 2+ on the right side and 2+ on the left side. Skin: Skin is warm. No rash noted. No cyanosis or erythema.  Psychiatric: Her mood appears anxious.  Well groomed, good eye contact.      ASSESSMENT  AND PLAN:   Ms. Courtne was seen today for dizziness.  Diagnoses and all orders for this visit:  Benign paroxysmal positional vertigo of left ear We discussed other possible etiologies of dizziness, Hx suggest benign vertigo, which seems to be better.Not able to reproduce dizziness with Apley maneuvers.  Explained that problem can be recurrent. Fall prevention. Vestibular exercises recommended, handout with Semont maneuvers given. Meclizine 25 mg tid prn, some side effects discussed. Instructed about warning signs. F/U as needed.  -     meclizine (ANTIVERT) 25 MG tablet; Take 1 tablet (25 mg total) by mouth 3 (three) times daily as needed for dizziness.  Recurrent occipital headache ? Tension like headache. Sleeping on a couch may aggravate cervical pain and headache.  Local massage and neck ROM exercises may help. Tylenol if needed, avoid NSAID's due to Hx of tinnitus. Keep appt with neurologist.  Nausea without vomiting Small and frequent sips of clear fluid. Zofran prn. Instructed about warning signs.  -     ondansetron (ZOFRAN) 4 MG tablet; Take 1 tablet (4 mg total) by mouth daily as needed for nausea or vomiting.     Return if symptoms worsen or fail to improve.      Nakeitha Milligan G. Martinique, MD  [facility]. [place] office.

## 2018-06-21 ENCOUNTER — Ambulatory Visit: Payer: BC Managed Care – PPO | Admitting: Neurology

## 2018-06-21 ENCOUNTER — Encounter

## 2018-06-21 ENCOUNTER — Encounter: Payer: Self-pay | Admitting: Neurology

## 2018-06-21 VITALS — BP 115/82 | HR 94 | Ht 66.5 in | Wt 173.0 lb

## 2018-06-21 DIAGNOSIS — R251 Tremor, unspecified: Secondary | ICD-10-CM | POA: Diagnosis not present

## 2018-06-21 DIAGNOSIS — M62838 Other muscle spasm: Secondary | ICD-10-CM | POA: Diagnosis not present

## 2018-06-21 DIAGNOSIS — R253 Fasciculation: Secondary | ICD-10-CM | POA: Diagnosis not present

## 2018-06-21 NOTE — Progress Notes (Signed)
Provider:  Larey Seat, M D  Referring Provider: Martinique, Betty G, MD Primary Care Physician:  Martinique, Betty G, MD  Chief Complaint  Patient presents with  . New Patient (Initial Visit)    Np for Parethesia and tremor. Alone. Rm 10. Patient mentioned that she has been dealing with paresthsia and tremors every day for the last 2 years.     HPI:  Brandi Cherry is a 52 y.o. female patient of portuguese- azorian ancestry. She is seen here as a referral  from Dr. Betty Martinique for involuntary movements, fasciculation and tremors.  She has seen multiple physicians, another PCP and another Neurologist in order to find a cause and treatment. She feels all the time an inner restlessness, sometimes visible to others. There is an ongoing tremor in the hands , fine tremor, of low amplitude.  All of this started 2 years ago after a severe onset vertigo spell in January 2018.  Vertigo was severe, associated with inability to stand and walk, nausea and vomiting.  There had been others , but none as severe.  She reports that she is feeling sometimes that she is floating, lights in her office irritate her and can make the vertigo sensation the sensation of lightheadedness and imbalance even worse.  She also has a sensation when she is walking down a long hallway, the feeling of going into a tunnel but closes in on her.  The patient's medical history is also interesting for osteoarthritis, frozen shoulder, a cardiac ablation in 2004 November and December polyp removal she was diagnosed with AVNRT in 2014 so I feel, attributed to pregnancy for a development during pregnancy. She just recently got a new cardiac monitor placed in order to make sure that it is not a cardiac issue causing these palpitations Tremors and muscle fasciculations that the result was normal. Her cardiologist is Dr. Teena Dunk at [facility].   Review of Systems: Out of a complete 14 system review, the patient complains of  only the following symptoms, and all other reviewed systems are negative.  Fasciculations, tremor, lightheadedness.   Social History   Socioeconomic History  . Marital status: Married    Spouse name: Not on file  . Number of children: 2  . Years of education: masters  . Highest education level: Not on file  Occupational History  . Occupation: Nurse, adult: [employer]  . Financial resource strain: Not on file  . Food insecurity:    Worry: Not on file    Inability: Not on file  . Transportation needs:    Medical: Not on file    Non-medical: Not on file  Tobacco Use  . Smoking status: Never Smoker  . Smokeless tobacco: Never Used  Substance and Sexual Activity  . Alcohol use: No    Alcohol/week: 0.0 standard drinks  . Drug use: No  . Sexual activity: Not on file  Lifestyle  . Physical activity:    Days per week: Not on file    Minutes per session: Not on file  . Stress: Not on file  Relationships  . Social connections:    Talks on phone: Not on file    Gets together: Not on file    Attends religious service: Not on file    Active member of club or organization: Not on file    Attends meetings of clubs or organizations: Not on file    Relationship status: Not  on file  . Intimate partner violence:    Fear of current or ex partner: Not on file    Emotionally abused: Not on file    Physically abused: Not on file    Forced sexual activity: Not on file  Other Topics Concern  . Not on file  Social History Narrative   Caffeine: occasional tea   Lives with husband, son and daughter   Occupation; Animal nutritionist at [employer]   Edu: [employer]   Activity: none currently   Diet: good water, daily fruits/vegetables, red meat 1x/wk, fish 2x/wk    Family History  Problem Relation Age of Onset  . Thyroid disease Mother 55  . Coronary artery disease Father 49       MI  . Hypertension Father   . Cancer Maternal Uncle         unsure  . Mental retardation Maternal Grandmother        bipolar  . Arthritis Other        strong on mother's side, no RA though  . Asthma Son   . Diabetes Neg Hx     Past Medical History:  Diagnosis Date  . Adhesive capsulitis of shoulder 2014   s/p GH steroid injection  . 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 [facility]ena [facility]unk at [facility])  . History of miscarriage    G5P2  . History of UTI    tends to get with relations  . 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 Hx of migraines    with cycle  . Melanoma in situ of face [facility])    gets yearly skin checks [name])  . 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 Urine incontinence   . Vertigo     Past Surgical History:  Procedure Laterality Date  . CARDIAC ELECTROPHYSIOLOGY STUDY AND ABLATION  04/2013   AVNRT (EP Dr. Teena Dunk at [facility])  . DILATION AND CURETTAGE OF UTERUS  05/2013   uterine polyp with menorrhagia  . hospitalization  2002   urosepsis, again with son    Current Outpatient Medications  Medication Sig Dispense Refill  . CALCIUM PO Take by mouth.    . Cholecalciferol (VITAMIN D PO) Take by mouth.    . COMBIPATCH 0.05-0.14 MG/DAY APPLY 1 PATCH TWICE A WEEK  12  . meclizine (ANTIVERT) 25 MG tablet Take 1 tablet (25 mg total) by mouth 3 (three) times daily as needed for dizziness. 30 tablet 2  . Melatonin 3 MG TABS Take 1 tablet by mouth at bedtime.    . Multiple Vitamin (MULTIVITAMIN) tablet Take 1 tablet by mouth daily.    . ondansetron (ZOFRAN) 4 MG tablet Take 1 tablet (4 mg total) by mouth daily as needed for nausea or vomiting. 10 tablet 0  . valACYclovir (VALTREX) 500 MG tablet Take 500 mg by mouth daily as needed.     . gabapentin (NEURONTIN) 100 MG capsule Take 3 capsules (300 mg total) by mouth at bedtime. (Patient not taking: Reported on 05/17/2018) 90 capsule 0   Current Facility-Administered Medications  Medication Dose Route  Frequency Provider Last Rate Last Dose  . diclofenac sodium (VOLTAREN) 1 % transdermal gel 2 g  2 g Topical QID PRN Dwana Melena L, PA-C        Allergies as of 06/21/2018  . (No Known Allergies)    Vitals:  BP 115/82   Pulse 94   Ht 5' 6.5" (1.689 m)   Wt 173 lb (78.5 kg)   LMP 06/01/2016 Comment: started bleeding on Friday 06/12/17  BMI 27.50 kg/m  Last Weight:  Wt Readings from Last 1 Encounters:  06/21/18 173 lb (78.5 kg)   Last Height:   Ht Readings from Last 1 Encounters:  06/21/18 5' 6.5" (1.689 m)    Physical exam:  General: The patient is awake, alert and appears not in acute distress.  The patient is well groomed. She speaks clearly, a little loud. She is cooperative.  Head: Normocephalic, atraumatic.  Neck is supple. Mallampati , neck circumference: 14.5"  Cardiovascular:  Regular rate and rhythm , without  murmurs or carotid bruit, and without distended neck veins. Respiratory: Lungs are clear to auscultation. Skin:  Without evidence of edema, or rash-  Trunk: BMI is 25.2  Kg/m2 in clothes.  Normal posture.  Neurologic exam : The patient is awake and alert, oriented to place and time.   Speech is fluent without   dysarthria, dysphonia or aphasia. Mood and affect are appropriate.  Cranial nerves: Pupils are equal and briskly reactive to light.  Extraocular movements in vertical and horizontal planes intact and without nystagmus.  Visual fields by finger perimetry are intact. Hearing to finger rub intact.  Facial sensation intact to fine touch. Facial motor strength is symmetric and tongue and uvula move midline. Tongue protrusion into either cheek is normal. Shoulder shrug is normal.   Motor exam:  Normal tone ,muscle bulk and symmetric  strength in all extremities.no muscle atrophy, but superficial fasciculation. She had a video recording of the left popliteal area which showed rhythmic twitching and I was able to see some  facilitations at the wrist.    Sensory:  Fine touch, pinprick and vibration were tested in all extremities. Proprioception was normal.  Coordination: Rapid alternating movements in the fingers/hands were normal. Finger-to-nose maneuver normal without evidence of ataxia, dysmetria or tremor.  Gait and station: Patient walks without assistive device and is able unassisted to climb up to the exam table. Strength within normal limits. Stance is stable and normal. Tandem gait is unfragmented. Romberg testing is negative.    Deep tendon reflexes: in the upper and lower extremities are symmetric and intact. No clonus and no upper and lower motor-neuron signs.   Assessment:  After physical and neurologic examination, review of laboratory studies, imaging, neurophysiology testing and pre-existing records, assessment is that of :  It's not ALS - benign fasciculations likely- and the tremor is bilaterally present and not disabiling. This is essential. She stays away from all caffeine , does not take albuterol, high doses of ASA or decongestion medications. Not on Cymbalta or Lyrica.   Plan:  Treatment plan and additional workup :  I will ask my neuromuscular colleagues to confirm the benign nature of the fasciculations. Ordered NCV and EMG.   There is a family history of cramps, spasms, neuropathy, Dupuytren's contractures and myalgia with excessive fatigue.    Asencion Partridge Rayann Jolley MD 06/21/2018

## 2018-06-21 NOTE — Patient Instructions (Addendum)
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 Favism usually is due to a genetic disorder.

## 2018-08-18 ENCOUNTER — Encounter: Payer: Self-pay | Admitting: Neurology

## 2018-08-26 ENCOUNTER — Encounter: Payer: BC Managed Care – PPO | Admitting: Neurology

## 2018-10-07 ENCOUNTER — Encounter: Payer: BC Managed Care – PPO | Admitting: Neurology

## 2018-10-13 ENCOUNTER — Encounter: Payer: Self-pay | Admitting: Family Medicine

## 2018-11-04 ENCOUNTER — Ambulatory Visit (INDEPENDENT_AMBULATORY_CARE_PROVIDER_SITE_OTHER): Payer: BC Managed Care – PPO | Admitting: Family Medicine

## 2018-11-04 ENCOUNTER — Other Ambulatory Visit: Payer: Self-pay

## 2018-11-04 DIAGNOSIS — R51 Headache: Secondary | ICD-10-CM | POA: Diagnosis not present

## 2018-11-04 DIAGNOSIS — R519 Headache, unspecified: Secondary | ICD-10-CM

## 2018-11-04 MED ORDER — NORTRIPTYLINE HCL 10 MG PO CAPS: 10.0000 mg | ORAL_CAPSULE | Freq: Every day | ORAL | 2 refills | Status: DC

## 2018-11-04 NOTE — Progress Notes (Signed)
Patient ID: Brandi Cherry, female   DOB: [birth year], 53 y.o.   MRN: [id number]  This visit type was conducted due to national recommendations for restrictions regarding the COVID-19 pandemic in an effort to limit this patient's exposure and mitigate transmission in our community.   Virtual Visit via Video Note  I connected with [name] on 11/04/18 at  8:30 AM EDT by a video enabled telemedicine application and verified that I am speaking with the correct person using two identifiers.  Location patient: home Location provider:work or home office Persons participating in the virtual visit: patient, provider  I discussed the limitations of evaluation and management by telemedicine and the availability of in person appointments. The patient expressed understanding and agreed to proceed.   HPI: Patient is consulting regarding headaches.  She has past history of migraine headaches but has particularly had more frequent headaches over the past several weeks.  She states that she was on hormone patch but had some bleeding.  She has been off the patch now for several weeks and thinks headaches have become more frequent since then.  Her bleeding has ceased.  She got her hormonal patch through gynecologist.  She has had some components of migraine such as occasional headaches behind the left eye, occasional nausea, photosensitivity but she states she also has a component that seems a little different.  She has some neck soreness and stiffness at times.  She is frequently waking at 4 to 5 AM with headache.  Poor sleep quality in general.  Gynecologist had prescribed Ambien previously but she had hangover drowsiness.  Her headaches are at least moderate severity.  She tried Tylenol with no relief.  Excedrin does help.  She knows to avoid daily analgesics to avoid analgesic withdrawal headache.  She denies any recent fever, stiff neck, exertional headache, tick bite, skin rash, focal neurologic symptoms,  seizure, confusion.  Her headaches are not daily and occurring about 3 to 4 days/week.  No recent dietary changes.  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    ROS: See pertinent positives and negatives per HPI.  Past Medical History:  Diagnosis Date  . Adhesive capsulitis of shoulder 2014   s/p GH steroid injection  . 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unk at [facility])  . History of miscarriage    G5P2  . History of UTI    tends to get with relations  . 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 Hx of migraines    with cycle  . Melanoma in situ of face [facility])    gets yearly skin checks [name])  . 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 Urine incontinence   . Vertigo     Past Surgical History:  Procedure Laterality Date  . CARDIAC ELECTROPHYSIOLOGY STUDY AND ABLATION  04/2013   AVNRT (EP Dr. Teena Dunk at [facility])  . DILATION AND CURETTAGE OF UTERUS  05/2013   uterine polyp with menorrhagia  . hospitalization  2002   urosepsis, again with son    Family History  Problem Relation Age of Onset  . Thyroid disease Mother 89  . Coronary artery disease Father 5       MI  . Hypertension Father   . Cancer Maternal Uncle  unsure  . Mental retardation Maternal Grandmother        bipolar  . Arthritis Other        strong on mother's side, no RA though  . Asthma Son   . Diabetes Neg Hx     SOCIAL HX: Non-smoker   Current Outpatient Medications:  .  CALCIUM PO, Take by mouth., Disp: , Rfl:  .  Cholecalciferol (VITAMIN D PO), Take by mouth., Disp: , Rfl:  .  meclizine (ANTIVERT) 25 MG tablet, Take 1 tablet (25 mg total) by mouth 3 (three) times daily as needed for dizziness., Disp: 30 tablet, Rfl: 2 .  Melatonin 3 MG TABS, Take 1 tablet by mouth at bedtime., Disp: , Rfl:  .  Multiple Vitamin (MULTIVITAMIN)  tablet, Take 1 tablet by mouth daily., Disp: , Rfl:  .  ondansetron (ZOFRAN) 4 MG tablet, Take 1 tablet (4 mg total) by mouth daily as needed for nausea or vomiting., Disp: 10 tablet, Rfl: 0 .  valACYclovir (VALTREX) 500 MG tablet, Take 500 mg by mouth daily as needed. , Disp: , Rfl:   Current Facility-Administered Medications:  .  diclofenac sodium (VOLTAREN) 1 % transdermal gel 2 g, 2 g, Topical, QID PRN, Aundra Dubin, PA-C  EXAM:  VITALS per patient if applicable:  GENERAL: alert, oriented, appears well and in no acute distress  HEENT: atraumatic, conjunttiva clear, no obvious abnormalities on inspection of external nose and ears  NECK: normal movements of the head and neck  LUNGS: on inspection no signs of respiratory distress, breathing rate appears normal, no obvious gross SOB, gasping or wheezing  CV: no obvious cyanosis  MS: moves all visible extremities without noticeable abnormality  PSYCH/NEURO: pleasant and cooperative, no obvious depression or anxiety, speech and thought processing grossly intact  ASSESSMENT AND PLAN:  Discussed the following assessment and plan:  Mixed headache she has some features of muscle contraction and migraine headache.  -We reviewed red flags to watch out for headaches such as fever, stiff neck, focal neurologic symptoms, daily progressive headache, exertional headache, etc. -We discussed potential triggers for migraine headache -Discussed importance of avoiding daily analgesics to avoid analgesic withdrawal headache -Discussed consideration for trial of low-dose medication such as nortriptyline which has been shown to benefit for prevention of migraines and muscle contraction headache.  Start with low dose such as 10 mg nightly and titrate up in a week or 2 if no improvement -We recommend follow-up with primary within a couple weeks if not seeing improvement.    I discussed the assessment and treatment plan with the patient. The  patient was provided an opportunity to ask questions and all were answered. The patient agreed with the plan and demonstrated an understanding of the instructions.   The patient was advised to call back or seek an in-person evaluation if the symptoms worsen or if the condition fails to improve as anticipated.     Carolann Littler, MD

## 2018-11-26 ENCOUNTER — Other Ambulatory Visit: Payer: Self-pay

## 2018-11-26 ENCOUNTER — Ambulatory Visit (INDEPENDENT_AMBULATORY_CARE_PROVIDER_SITE_OTHER): Payer: BC Managed Care – PPO | Admitting: Family Medicine

## 2018-11-26 DIAGNOSIS — R51 Headache: Secondary | ICD-10-CM

## 2018-11-26 DIAGNOSIS — R253 Fasciculation: Secondary | ICD-10-CM | POA: Diagnosis not present

## 2018-11-26 DIAGNOSIS — R519 Headache, unspecified: Secondary | ICD-10-CM

## 2018-11-26 NOTE — Progress Notes (Signed)
Patient ID: Brandi Cherry, female   DOB: Feb 23, 1966, 53 y.o.   MRN: [id number]  This visit type was conducted due to national recommendations for restrictions regarding the COVID-19 pandemic in an effort to limit this patient's exposure and mitigate transmission in our community.   Virtual Visit via Video Note  I connected with Brandi Cherry on 11/26/18 at  9:30 AM EDT by a video enabled telemedicine application and verified that I am speaking with the correct person using two identifiers.  Location patient: home Location provider:work or home office Persons participating in the virtual visit: patient, provider  I discussed the limitations of evaluation and management by telemedicine and the availability of in person appointments. The patient expressed understanding and agreed to proceed.   HPI: Patient had been seen by video encounter recently with mixed type headache which was occurring several days per week.  We started low-dose nortriptyline 10 mg at night and her headaches after just a few days basically went away.  She was very pleased with that.  However, she has history of some benign fasciculations and has had increased fasciculations since starting the Pamelor including head, chest, upper extremities.  She held Pamelor last night and already feels that these are improved today.  She did not describe any true extraparametal type side effects.  He has no headache at this time.   ROS: See pertinent positives and negatives per HPI.  Past Medical History:  Diagnosis Date  . Adhesive capsulitis of shoulder 2014   s/p GH steroid injection  . 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 at [facility])  . History of miscarriage    G5P2  . History of UTI    tends to get with relations  . 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 Hx of migraines    with cycle  . Melanoma in situ of face [facility])    gets  yearly skin checks [name])  . 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 Urine incontinence   . Vertigo     Past Surgical History:  Procedure Laterality Date  . CARDIAC ELECTROPHYSIOLOGY STUDY AND ABLATION  04/2013   AVNRT (EP Dr. Teena Dunk at [facility])  . DILATION AND CURETTAGE OF UTERUS  05/2013   uterine polyp with menorrhagia  . hospitalization  2002   urosepsis, again with son    Family History  Problem Relation Age of Onset  . Thyroid disease Mother 65  . Coronary artery disease Father 39       MI  . Hypertension Father   . Cancer Maternal Uncle        unsure  . Mental retardation Maternal Grandmother        bipolar  . Arthritis Other        strong on mother's side, no RA though  . Asthma Son   . Diabetes Neg Hx     SOCIAL HX:  Non-smoker   Current Outpatient Medications:  .  CALCIUM PO, Take by mouth., Disp: , Rfl:  .  Cholecalciferol (VITAMIN D PO), Take by mouth., Disp: , Rfl:  .  meclizine (ANTIVERT) 25 MG tablet, Take 1 tablet (25 mg total) by mouth 3 (three) times daily as needed for dizziness., Disp: 30 tablet, Rfl: 2 .  Melatonin 3 MG TABS, Take 1 tablet by mouth at bedtime., Disp: , Rfl:  .  Multiple  Vitamin (MULTIVITAMIN) tablet, Take 1 tablet by mouth daily., Disp: , Rfl:  .  ondansetron (ZOFRAN) 4 MG tablet, Take 1 tablet (4 mg total) by mouth daily as needed for nausea or vomiting., Disp: 10 tablet, Rfl: 0 .  valACYclovir (VALTREX) 500 MG tablet, Take 500 mg by mouth daily as needed. , Disp: , Rfl:   Current Facility-Administered Medications:  .  diclofenac sodium (VOLTAREN) 1 % transdermal gel 2 g, 2 g, Topical, QID PRN, Aundra Dubin, PA-C  EXAM:  VITALS per patient if applicable:  GENERAL: alert, oriented, appears well and in no acute distress  HEENT: atraumatic, conjunttiva clear, no obvious abnormalities on inspection of external nose and ears  NECK: normal movements of the head and neck  LUNGS: on inspection no signs of respiratory  distress, breathing rate appears normal, no obvious gross SOB, gasping or wheezing  CV: no obvious cyanosis  MS: moves all visible extremities without noticeable abnormality  PSYCH/NEURO: pleasant and cooperative, no obvious depression or anxiety, speech and thought processing grossly intact  ASSESSMENT AND PLAN:  Discussed the following assessment and plan:  #1 mixed headaches improved after initiation of nortriptyline  #2 recurrent fasciculations possibly exacerbated by medication above  -We recommended discontinuation of nortriptyline at this time.  Patient would like to observe to see if headaches recur before looking at other potential medication options and this seems very reasonable.  -Follow-up with Dr. Martinique for any ongoing problems    I discussed the assessment and treatment plan with the patient. The patient was provided an opportunity to ask questions and all were answered. The patient agreed with the plan and demonstrated an understanding of the instructions.   The patient was advised to call back or seek an in-person evaluation if the symptoms worsen or if the condition fails to improve as anticipated.    Carolann Littler, MD

## 2018-12-24 ENCOUNTER — Institutional Professional Consult (permissible substitution): Payer: BC Managed Care – PPO | Admitting: Plastic Surgery

## 2019-01-06 ENCOUNTER — Other Ambulatory Visit: Payer: Self-pay

## 2019-01-06 ENCOUNTER — Ambulatory Visit (AMBULATORY_SURGERY_CENTER): Payer: Self-pay

## 2019-01-06 ENCOUNTER — Encounter: Payer: Self-pay | Admitting: Gastroenterology

## 2019-01-06 VITALS — Ht 66.0 in | Wt 168.0 lb

## 2019-01-06 DIAGNOSIS — Z1211 Encounter for screening for malignant neoplasm of colon: Secondary | ICD-10-CM

## 2019-01-06 MED ORDER — SUPREP BOWEL PREP KIT 17.5-3.13-1.6 GM/177ML PO SOLN: 1.0000 | Freq: Once | ORAL | 0 refills | Status: AC

## 2019-01-06 NOTE — Progress Notes (Signed)
Per pt, no allergies to soy or egg products.Pt not taking any weight loss meds or using  O2 at home. Pt denies sedation problems.  Pt refused emmi video.   The PV was done over the phone due to COVID-19. Verified the pt's address and and insurance. Reviewed pt's medical hx and prep instructions with her and will mail paperwork to her today. Informed pt to call with any questions or changes prior to her procedure. She understood.

## 2019-01-11 ENCOUNTER — Institutional Professional Consult (permissible substitution): Payer: BC Managed Care – PPO | Admitting: Plastic Surgery

## 2019-01-21 ENCOUNTER — Telehealth: Payer: Self-pay | Admitting: Gastroenterology

## 2019-01-21 NOTE — Telephone Encounter (Signed)

## 2019-01-22 ENCOUNTER — Ambulatory Visit (AMBULATORY_SURGERY_CENTER): Payer: BC Managed Care – PPO | Admitting: Gastroenterology

## 2019-01-22 ENCOUNTER — Encounter: Payer: Self-pay | Admitting: Gastroenterology

## 2019-01-22 ENCOUNTER — Other Ambulatory Visit: Payer: Self-pay

## 2019-01-22 VITALS — BP 101/67 | HR 85 | Temp 97.7°F | Resp 15 | Ht 66.5 in | Wt 173.0 lb

## 2019-01-22 DIAGNOSIS — Z1211 Encounter for screening for malignant neoplasm of colon: Secondary | ICD-10-CM | POA: Diagnosis present

## 2019-01-22 DIAGNOSIS — D125 Benign neoplasm of sigmoid colon: Secondary | ICD-10-CM

## 2019-01-22 DIAGNOSIS — D122 Benign neoplasm of ascending colon: Secondary | ICD-10-CM | POA: Diagnosis not present

## 2019-01-22 DIAGNOSIS — K635 Polyp of colon: Secondary | ICD-10-CM

## 2019-01-22 MED ORDER — SODIUM CHLORIDE 0.9 % IV SOLN: 500.0000 mL | Freq: Once | INTRAVENOUS | Status: DC

## 2019-01-22 NOTE — Patient Instructions (Signed)
YOU HAD AN ENDOSCOPIC PROCEDURE TODAY AT [facility]:   Refer to the procedure report that was given to you for any specific questions about what was found during the examination.  If the procedure report does not answer your questions, please call your gastroenterologist to clarify.  If you requested that your care partner not be given the details of your procedure findings, then the procedure report has been included in a sealed envelope for you to review at your convenience later.  Handouts given : Polyps and Hemorrhoids   YOU SHOULD EXPECT: Some feelings of bloating in the abdomen. Passage of more gas than usual.  Walking can help get rid of the air that was put into your GI tract during the procedure and reduce the bloating. If you had a lower endoscopy (such as a colonoscopy or flexible sigmoidoscopy) you may notice spotting of blood in your stool or on the toilet paper. If you underwent a bowel prep for your procedure, you may not have a normal bowel movement for a few days.  Please Note:  You might notice some irritation and congestion in your nose or some drainage.  This is from the oxygen used during your procedure.  There is no need for concern and it should clear up in a day or so.  SYMPTOMS TO REPORT IMMEDIATELY:   Following lower endoscopy (colonoscopy or flexible sigmoidoscopy):  Excessive amounts of blood in the stool  Significant tenderness or worsening of abdominal pains  Swelling of the abdomen that is new, acute  Fever of 100F or higher   For urgent or emergent issues, a gastroenterologist can be reached at any hour by calling [phone].   DIET:  We do recommend a small meal at first, but then you may proceed to your regular diet.  Drink plenty of fluids but you should avoid alcoholic beverages for 24 hours.  ACTIVITY:  You should plan to take it easy for the rest of today and you should NOT DRIVE or use heavy machinery until tomorrow (because of the  sedation medicines used during the test).    FOLLOW UP: Our staff will call the number listed on your records 48-72 hours following your procedure to check on you and address any questions or concerns that you may have regarding the information given to you following your procedure. If we do not reach you, we will leave a message.  We will attempt to reach you two times.  During this call, we will ask if you have developed any symptoms of COVID 19. If you develop any symptoms (ie: fever, flu-like symptoms, shortness of breath, cough etc.) before then, please call [phone].  If you test positive for Covid 19 in the 2 weeks post procedure, please call and report this information to [place].    If any biopsies were taken you will be contacted by phone or by letter within the next 1-3 weeks.  Please call us at [phone] if you have not heard about the biopsies in 3 weeks.    SIGNATURES/CONFIDENTIALITY: You and/or your care partner have signed paperwork which will be entered into your electronic medical record.  These signatures attest to the fact that that the information above on your After Visit Summary has been reviewed and is understood.  Full responsibility of the confidentiality of this discharge information lies with you and/or your care-partner.

## 2019-01-22 NOTE — Op Note (Signed)
[facility] Patient Name: Brandi Cherry Procedure Date: 01/22/2019 9:57 AM MRN: [id number] Endoscopist: Mauri Pole , MD Age: 53 Referring MD:  Date of Birth: July 09, 1965 Gender: Female Account #: [id number] Procedure:                Colonoscopy Indications:              Screening for colorectal malignant neoplasm Medicines:                Monitored Anesthesia Care Procedure:                Pre-Anesthesia Assessment:                           - Prior to the procedure, a History and Physical                            was performed, and patient medications and                            allergies were reviewed. The patient's tolerance of                            previous anesthesia was also reviewed. The risks                            and benefits of the procedure and the sedation                            options and risks were discussed with the patient.                            All questions were answered, and informed consent                            was obtained. Prior Anticoagulants: The patient has                            taken no previous anticoagulant or antiplatelet                            agents. ASA Grade Assessment: II - A patient with                            mild systemic disease. After reviewing the risks                            and benefits, the patient was deemed in                            satisfactory condition to undergo the procedure.                           After obtaining informed consent, the colonoscope  was passed under direct vision. Throughout the                            procedure, the patient's blood pressure, pulse, and                            oxygen saturations were monitored continuously. The                            Colonoscope was introduced through the anus and                            advanced to the the cecum, identified by                            appendiceal orifice and  ileocecal valve. The                            colonoscopy was performed without difficulty. The                            patient tolerated the procedure well. The quality                            of the bowel preparation was excellent. The                            ileocecal valve, appendiceal orifice, and rectum                            were photographed. Scope In: 10:05:09 AM Scope Out: 10:22:06 AM Scope Withdrawal Time: 0 hours 12 minutes 40 seconds  Total Procedure Duration: 0 hours 16 minutes 57 seconds  Findings:                 The perianal and digital rectal examinations were                            normal.                           Two sessile polyps were found in the sigmoid colon                            and ascending colon. The polyps were 3 to 9 mm in                            size. These polyps were removed with a cold snare.                            Resection and retrieval were complete.                           Non-bleeding internal hemorrhoids were found during  retroflexion. The hemorrhoids were medium-sized.                           The exam was otherwise without abnormality. Complications:            No immediate complications. Estimated Blood Loss:     Estimated blood loss was minimal. Impression:               - Two 3 to 9 mm polyps in the sigmoid colon and in                            the ascending colon, removed with a cold snare.                            Resected and retrieved.                           - Non-bleeding internal hemorrhoids.                           - The examination was otherwise normal. Recommendation:           - Patient has a contact number available for                            emergencies. The signs and symptoms of potential                            delayed complications were discussed with the                            patient. Return to normal activities tomorrow.                             Written discharge instructions were provided to the                            patient.                           - Resume previous diet.                           - Continue present medications.                           - Await pathology results.                           - Repeat colonoscopy in 5-10 years for surveillance                            based on pathology results. Mauri Pole, MD 01/22/2019 10:25:56 AM This report has been signed electronically.

## 2019-01-22 NOTE — Progress Notes (Signed)
A/ox3, pleased with MAC, report to RN 

## 2019-01-22 NOTE — Progress Notes (Signed)
Called to room to assist during endoscopic procedure.  Patient ID and intended procedure confirmed with present staff. Received instructions for my participation in the procedure from the performing physician.  

## 2019-01-22 NOTE — Progress Notes (Signed)
Pt's states no medical or surgical changes since previsit or office visit.  [facility] CMA vitals and June Bullock temps.

## 2019-01-25 ENCOUNTER — Telehealth: Payer: Self-pay

## 2019-01-25 NOTE — Telephone Encounter (Signed)
Attempted to reach patient for post-procedure f/u call. No answer. This is the 2nd attempt. Left message to call us with any concerns.

## 2019-01-25 NOTE — Telephone Encounter (Signed)
Attempted to reach patient for post-procedure f/u call. No answer. Left message for her to please not hesitate to call us if she has any questions/concerns regarding her care or if she develops SXS of COVID19 in the near future.

## 2019-02-08 ENCOUNTER — Encounter: Payer: Self-pay | Admitting: Gastroenterology

## 2019-02-11 ENCOUNTER — Ambulatory Visit: Payer: BC Managed Care – PPO | Admitting: Plastic Surgery

## 2019-02-11 ENCOUNTER — Other Ambulatory Visit: Payer: Self-pay

## 2019-02-11 ENCOUNTER — Encounter: Payer: Self-pay | Admitting: Plastic Surgery

## 2019-02-11 VITALS — BP 126/86 | HR 94 | Temp 98.2°F | Ht 66.0 in | Wt 169.0 lb

## 2019-02-11 DIAGNOSIS — Z8669 Personal history of other diseases of the nervous system and sense organs: Secondary | ICD-10-CM

## 2019-02-11 DIAGNOSIS — R42 Dizziness and giddiness: Secondary | ICD-10-CM | POA: Diagnosis not present

## 2019-02-11 DIAGNOSIS — D164 Benign neoplasm of bones of skull and face: Secondary | ICD-10-CM | POA: Diagnosis not present

## 2019-02-11 DIAGNOSIS — I471 Supraventricular tachycardia: Secondary | ICD-10-CM | POA: Diagnosis not present

## 2019-02-11 NOTE — Progress Notes (Signed)
Patient ID: Brandi Cherry, female    DOB: 09/14/1965, 53 y.o.   MRN: [id number]   Chief Complaint  Patient presents with  . Advice Only    The patient is a 53 year old female here for evaluation of a mass on her forehead.  The patient states that she has noticed this for months and it seems to be getting larger.  She had a skin excision of an abnormal nevus right over the area.  This appeared after that.  She thinks that it may be related.  She has had melanoma in situ and basal cell carcinoma in the past.  This area is slightly tender and this is concerning her.  She is 5 feet 6 inches tall and weighs 169 pounds masses 1.5 cm in size firm and not movable.   Review of Systems  Constitutional: Negative for activity change and appetite change.  Eyes: Negative.   Respiratory: Negative for chest tightness and shortness of breath.   Cardiovascular: Negative for leg swelling.  Gastrointestinal: Negative for abdominal pain.  Endocrine: Negative.   Genitourinary: Negative.   Musculoskeletal: Negative.   Psychiatric/Behavioral: Negative.     Past Medical History:  Diagnosis Date  . Adhesive capsulitis of shoulder 2014   s/p GH steroid injection  . 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 [facility]ena [facility]unk at [facility])  . Benign fasciculations    over 2 years  . 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 Hemorrhoids    has had since childbirth  . History of miscarriage    G5P2  . History of UTI    tends to get with relations  . 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 Hx of migraines    with cycle  . Melanoma in situ of face [facility])    gets yearly skin checks [name])  . 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 Post-operative nausea and vomiting   . Urine incontinence   . Vertigo     Past Surgical History:  Procedure Laterality Date  . CARDIAC ELECTROPHYSIOLOGY STUDY AND  ABLATION  04/2013   AVNRT (EP Dr. Teena [facility]unk at [facility])  . DILATION AND CURETTAGE OF UTERUS  05/2013   uterine polyp with menorrhagia  . hospitalization  2002   urosepsis, again with son      Current Outpatient Medications:  .  CALCIUM PO, Take by mouth. Calcium and vit d chewable-Chew 2 daily, Disp: , Rfl:  .  Cholecalciferol (VITAMIN D PO), Take by mouth., Disp: , Rfl:  .  meclizine (ANTIVERT) 25 MG tablet, Take 1 tablet (25 mg total) by mouth 3 (three) times daily as needed for dizziness. (Patient not taking: Reported on 01/06/2019), Disp: 30 tablet, Rfl: 2 .  Melatonin 3 MG TABS, Take 1 tablet by mouth at bedtime., Disp: , Rfl:  .  Multiple Vitamin (MULTIVITAMIN) tablet, Take 1 tablet by mouth daily., Disp: , Rfl:  .  ondansetron (ZOFRAN) 4 MG tablet, Take 1 tablet (4 mg total) by mouth daily as needed for nausea or vomiting. (Patient not taking: Reported on 01/06/2019), Disp: 10 tablet, Rfl: 0 .  valACYclovir (VALTREX) 500 MG tablet, Take 500 mg by mouth daily as needed. , Disp: , Rfl:   Current Facility-Administered Medications:  .  diclofenac sodium (VOLTAREN) 1 % transdermal gel 2 g,  2 g, Topical, QID PRN, Dwana Melena L, PA-C   Objective:   Vitals:   02/11/19 1043  BP: 126/86  Pulse: 94  Temp: 98.2 F (36.8 C)  SpO2: 97%    Physical Exam Vitals signs and nursing note reviewed.  Constitutional:      Appearance: Normal appearance.  HENT:     Head: Normocephalic.     Nose: Nose normal.     Mouth/Throat:     Mouth: Mucous membranes are moist.  Eyes:     Extraocular Movements: Extraocular movements intact.  Cardiovascular:     Rate and Rhythm: Normal rate.  Pulmonary:     Effort: Pulmonary effort is normal.  Abdominal:     General: Abdomen is flat. There is no distension.  Neurological:     General: No focal deficit present.     Mental Status: She is alert and oriented to person, place, and time.  Psychiatric:        Mood and Affect: Mood normal.        Behavior:  Behavior normal.        Thought Content: Thought content normal.     Assessment & Plan:     ICD-10-CM   1. Paroxysmal atrial tachycardia (HCC)  I47.1   2. Vertigo  R42   3. Hx of migraines  Z86.69   4. 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  Recommend we do this in the operating room Pictures were obtained of the patient and placed in the chart with the patient's or guardian's permission.   [place], DO

## 2019-04-15 ENCOUNTER — Ambulatory Visit (INDEPENDENT_AMBULATORY_CARE_PROVIDER_SITE_OTHER): Payer: BC Managed Care – PPO | Admitting: Family Medicine

## 2019-04-15 ENCOUNTER — Other Ambulatory Visit: Payer: Self-pay

## 2019-04-15 ENCOUNTER — Encounter: Payer: Self-pay | Admitting: Family Medicine

## 2019-04-15 VITALS — BP 120/70 | HR 76 | Temp 97.1°F | Resp 12 | Ht 66.0 in | Wt 171.0 lb

## 2019-04-15 DIAGNOSIS — Z13220 Encounter for screening for lipoid disorders: Secondary | ICD-10-CM | POA: Diagnosis not present

## 2019-04-15 DIAGNOSIS — Z13228 Encounter for screening for other metabolic disorders: Secondary | ICD-10-CM

## 2019-04-15 DIAGNOSIS — Z130 Encounter for screening for diseases of the blood and blood-forming organs and certain disorders involving the immune mechanism: Secondary | ICD-10-CM | POA: Diagnosis not present

## 2019-04-15 DIAGNOSIS — Z0000 Encounter for general adult medical examination without abnormal findings: Secondary | ICD-10-CM

## 2019-04-15 DIAGNOSIS — R519 Headache, unspecified: Secondary | ICD-10-CM | POA: Diagnosis not present

## 2019-04-15 DIAGNOSIS — Z1329 Encounter for screening for other suspected endocrine disorder: Secondary | ICD-10-CM

## 2019-04-15 DIAGNOSIS — D0330 Melanoma in situ of unspecified part of face: Secondary | ICD-10-CM

## 2019-04-15 LAB — LIPID PANEL
Cholesterol: 157 mg/dL (ref 0–200)
HDL: 37.4 mg/dL — ABNORMAL LOW (ref >39.00)
LDL Cholesterol: 100 mg/dL — ABNORMAL HIGH (ref 0–99)
NonHDL: 120.07
Total CHOL/HDL Ratio: 4
Triglycerides: 100 mg/dL (ref 0.0–149.0)
VLDL: 20 mg/dL (ref 0.0–40.0)

## 2019-04-15 LAB — BASIC METABOLIC PANEL
BUN: 7 mg/dL (ref 6–23)
CO2: 25 mEq/L (ref 19–32)
Calcium: 8.9 mg/dL (ref 8.4–10.5)
Chloride: 104 mEq/L (ref 96–112)
Creatinine, Ser: 0.63 mg/dL (ref 0.40–1.20)
GFR: 99.12 mL/min (ref >60.00)
Glucose, Bld: 90 mg/dL (ref 70–99)
Potassium: 4.1 mEq/L (ref 3.5–5.1)
Sodium: 138 mEq/L (ref 135–145)

## 2019-04-15 LAB — HEMOGLOBIN A1C: Hgb A1c MFr Bld: 5.6 % (ref 4.6–6.5)

## 2019-04-15 LAB — TSH: TSH: 1.37 u[IU]/mL (ref 0.35–4.50)

## 2019-04-15 NOTE — Progress Notes (Signed)
HPI:   [name] is a 53 y.o. female, who is here today for her routine physical.   Last CPE: > a year ago. She saw her gynecologist 01/28/19  Regular exercise 3 or more time per week: 4 weeks ago she started exercising at least 4 times per week,using an app with yoga and wts. Following a healthy diet: Not consistently, likes rice and pasta.  She lives with her husband and son. Her daughter went to college,[employer].  Chronic medical problems: Tinnitus,anxiety,OA,tremor,headache,vertigo, and SVT among some.  Since her last visit she has had a frontal BCC removed, she underwent Mohs surgery. Hx of in situ melanoma and has had BCC removed before. She follows with dermatologist periodically.   Pap smear: 01/28/19. She follows with gyn,Dr Corinna Capra regularly. She is on hormonal therapy, patch.  Immunization History  Administered Date(s) Administered  . Tdap 07/16/2011    Mammogram: 12/2018. Colonoscopy: 01/22/19, 7 years f/u was recommended. DEXA:Reporting right hip osteopenia.  Concerns today: She would like her thyroid function to be checked. Last TSH in 03/2018 normal at 1.4.  In early 09/2018 she was having left-sided neck pain radiated to temporal and frontal area.  This headache was different to her usual migraines. Problem has improved greatly, she thinks headache was related with posture, being seated all day for another computer.  Review of Systems  Constitutional: Positive for fatigue. Negative for appetite change and fever.  HENT: Negative for hearing loss, mouth sores, sore throat and trouble swallowing.   Eyes: Negative for redness and visual disturbance.  Respiratory: Negative for cough, shortness of breath and wheezing.   Cardiovascular: Negative for chest pain and leg swelling.  Gastrointestinal: Negative for abdominal pain, nausea and vomiting.       No changes in bowel habits.  Endocrine: Negative for cold intolerance, heat intolerance,  polydipsia, polyphagia and polyuria.  Genitourinary: Negative for decreased urine volume, dysuria, hematuria, vaginal bleeding and vaginal discharge.  Musculoskeletal: Positive for arthralgias and neck pain. Negative for back pain.  Skin: Negative for color change and rash.  Allergic/Immunologic: Positive for environmental allergies.  Neurological: Positive for headaches. Negative for syncope and weakness.  Hematological: Negative for adenopathy. Does not bruise/bleed easily.  Psychiatric/Behavioral: Negative for confusion and sleep disturbance. The patient is nervous/anxious.   All other systems reviewed and are negative.  Current Outpatient Medications on File Prior to Visit  Medication Sig Dispense Refill  . CALCIUM PO Take by mouth. Calcium and vit d chewable-Chew 2 daily    . COMBIPATCH 0.05-0.14 MG/DAY APPLY 1 PATCH TWICE A WEEK AS DIRECTED FOR 30 DAYS    . Multiple Vitamin (MULTIVITAMIN) tablet Take 1 tablet by mouth daily.    . valACYclovir (VALTREX) 500 MG tablet Take 500 mg by mouth daily as needed.      Current Facility-Administered Medications on File Prior to Visit  Medication Dose Route Frequency Provider Last Rate Last Dose  . diclofenac sodium (VOLTAREN) 1 % transdermal gel 2 g  2 g Topical QID PRN Nathaniel Man         Past Medical History:  Diagnosis Date  . Adhesive capsulitis of shoulder 2014   s/p GH steroid injection  . 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unk at [facility])  . Benign fasciculations    over 2 years  . 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 Hemorrhoids  has had since childbirth  . History of miscarriage    G5P2  . History of UTI    tends to get with relations  . 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 Hx of migraines    with cycle  . Melanoma in situ of face [facility])    gets yearly skin checks [name])   . 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 Post-operative nausea and vomiting   . Urine incontinence   . Vertigo     Past Surgical History:  Procedure Laterality Date  . CARDIAC ELECTROPHYSIOLOGY STUDY AND ABLATION  04/2013   AVNRT (EP Dr. [facility]ena [facility]unk at [facility])  . DILATION AND CURETTAGE OF UTERUS  05/2013   uterine polyp with menorrhagia  . hospitalization  2002   urosepsis, again with son    Allergies  Allergen Reactions  . Pamelor [Nortriptyline Hcl] Other (See Comments)    fasciculations    Family History  Problem Relation Age of Onset  . Thyroid disease Mother 48  . Neuropathy Mother   . Coronary artery disease Father 33       MI  . Hypertension Father   . Cancer Maternal Uncle        unsure  . Mental retardation Maternal Grandmother        bipolar  . Arthritis Other        strong on mother's side, no RA though  . Asthma Son   . Colon cancer Maternal Grandfather   . Diabetes Neg Hx   . Rectal cancer Neg Hx     Social History   Socioeconomic History  . Marital status: Married    Spouse name: Not on file  . Number of children: 2  . Years of education: masters  . Highest education level: Not on file  Occupational History  . Occupation: Nurse, adult: [employer]  . Financial resource strain: Not on file  . Food insecurity    Worry: Not on file    Inability: Not on file  . Transportation needs    Medical: Not on file    Non-medical: Not on file  Tobacco Use  . Smoking status: Never Smoker  . Smokeless tobacco: Never Used  Substance and Sexual Activity  . Alcohol use: No    Alcohol/week: 0.0 standard drinks  . Drug use: No  . Sexual activity: Not on file  Lifestyle  . Physical activity    Days per week: Not on file    Minutes per session: Not on file  . Stress: Not on file  Relationships  . Social Herbalist on phone: Not on file    Gets together: Not on file    Attends religious service: Not on file     Active member of club or organization: Not on file    Attends meetings of clubs or organizations: Not on file    Relationship status: Not on file  Other Topics Concern  . Not on file  Social History Narrative   Caffeine: occasional tea   Lives with husband, son and daughter   Occupation; Animal nutritionist at [employer]   Edu: Masters   Activity: none currently   Diet: good water, daily fruits/vegetables, red meat 1x/wk, fish 2x/wk    Vitals:   04/15/19 0933  BP: 120/70  Pulse: 76  Resp: 12  Temp: (!) 97.1 F (36.2 C)  SpO2: 98%   Body mass index is 27.6 kg/m.   Wt Readings from Last 3 Encounters:  04/15/19 171 lb (77.6 kg)  02/11/19 169 lb (76.7 kg)  01/22/19 173 lb (78.5 kg)   Physical Exam  Nursing note and vitals reviewed. Constitutional: She is oriented to person, place, and time. She appears well-developed. No distress.  HENT:  Head: Normocephalic and atraumatic.  Right Ear: Hearing, tympanic membrane, external ear and ear canal normal.  Left Ear: Hearing, tympanic membrane, external ear and ear canal normal.  Mouth/Throat: Uvula is midline, oropharynx is clear and moist and mucous membranes are normal.  Eyes: Pupils are equal, round, and reactive to light. Conjunctivae and EOM are normal.  Neck: No tracheal deviation present. No thyromegaly present.  Cardiovascular: Normal rate and regular rhythm.  No murmur heard. Pulses:      Dorsalis pedis pulses are 2+ on the right side and 2+ on the left side.  Respiratory: Effort normal and breath sounds normal. No respiratory distress.  GI: Soft. She exhibits no mass. There is no hepatomegaly. There is no abdominal tenderness.  Genitourinary:    Genitourinary Comments: Deferred to gyn.   Musculoskeletal:        General: No edema.     Comments: No major deformity or signs of synovitis appreciated.  Lymphadenopathy:    She has no cervical adenopathy.       Right: No supraclavicular adenopathy present.        Left: No supraclavicular adenopathy present.  Neurological: She is alert and oriented to person, place, and time. She has normal strength. No cranial nerve deficit. Coordination and gait normal.  Reflex Scores:      Bicep reflexes are 2+ on the right side and 2+ on the left side.      Patellar reflexes are 2+ on the right side and 2+ on the left side. Skin: Skin is warm. No rash noted. No erythema.  Psychiatric: Her mood appears anxious. Cognition and memory are normal.  Well groomed, good eye contact.   ASSESSMENT AND PLAN:  Ms. Zabdi Deberardinis was here today annual physical examination.   Orders Placed This Encounter  Procedures  . Basic metabolic panel  . TSH  . Lipid panel  . Hemoglobin A1c    Lab Results  Component Value Date   HGBA1C 5.6 04/15/2019   Lab Results  Component Value Date   CHOL 157 04/15/2019   HDL 37.40 (L) 04/15/2019   LDLCALC 100 (H) 04/15/2019   TRIG 100.0 04/15/2019   CHOLHDL 4 04/15/2019   Lab Results  Component Value Date   TSH 1.37 04/15/2019   Lab Results  Component Value Date   CREATININE 0.63 04/15/2019   BUN 7 04/15/2019   NA 138 04/15/2019   K 4.1 04/15/2019   CL 104 04/15/2019   CO2 25 04/15/2019    Routine general medical examination at a health care facility We discussed the importance of regular physical activity and healthy diet for prevention of chronic illness and/or complications. Preventive guidelines reviewed. Vaccination up to date. Continue following with gyn for her female preventive care. Ca++ and vit D supplementation recommended. Next CPE in a year.  The 10-year ASCVD risk score Mikey Bussing DC Brooke Bonito., et al., 2013) is: 1.5%   Values used to calculate the score:     Age: 81 years     Sex: Female     Is Non-Hispanic African American: No  Diabetic: No     Tobacco smoker: No     Systolic Blood Pressure: 123456 mmHg     Is BP treated: No     HDL Cholesterol: 37.4 mg/dL     Total Cholesterol: 157 mg/dL   Recurrent occipital headache Improved. ? Tension headache. Instructed about warning signs. 05/2016 head CT negative for intracranial abnormality.  -     TSH  Screening for lipoid disorders -     Lipid panel  Screening for endocrine, metabolic and immunity disorder -     Basic metabolic panel -     Hemoglobin A1c  Melanoma in situ of face ([place]) And recently [place] removed. Sun screen and avoidance of direct sun exposure. Continue following with dermatologist.    Return in 1 year (on 04/14/2020) for cpe.    Betty G. Martinique, MD  [facility]. [place] office.

## 2019-04-15 NOTE — Patient Instructions (Addendum)
Today you have you routine preventive visit.  At least 150 minutes of moderate exercise per week, daily brisk walking for 15-30 min is a good exercise option. Healthy diet low in saturated (animal) fats and sweets and consisting of fresh fruits and vegetables, lean meats such as fish and white chicken and whole grains.  These are some of recommendations for screening depending of age and risk factors:  Routine general medical examination at a health care facility  Recurrent occipital headache - Plan: TSH  Screening for lipoid disorders - Plan: Lipid panel  Screening for endocrine, metabolic and immunity disorder - Plan: Basic metabolic panel, Hemoglobin A1c   - Vaccines:  Tdap vaccine every 10 years.  Shingles vaccine recommended at age 75, could be given after 52 years of age but not sure about insurance coverage.   Pneumonia vaccines:  Prevnar 13 at 65 and Pneumovax at 15. Sometimes Pneumovax is giving earlier if history of smoking, lung disease,diabetes,kidney disease among some.    Screening for diabetes at age 82 and every 3 years.  Cervical cancer prevention:  Pap smear starts at 52 years of age and continues periodically until 52 years old in low risk women. Pap smear every 3 years between 40 and 27 years old. Pap smear every 3-5 years between women 21 and older if pap smear negative and HPV screening negative.   -Breast cancer: Mammogram: There is disagreement between experts about when to start screening in low risk asymptomatic female but recent recommendations are to start screening at 28 and not later than 52 years old , every 1-2 years and after 52 yo q 2 years. Screening is recommended until 52 years old but some women can continue screening depending of healthy issues.   Colon cancer screening: starts at 52 years old until 52 years old.  Cholesterol disorder screening at age 20 and every 3 years.  Also recommended:  1. Dental visit- Brush and floss your teeth  twice daily; visit your dentist twice a year. 2. Eye doctor- Get an eye exam at least every 2 years. 3. Helmet use- Always wear a helmet when riding a bicycle, motorcycle, rollerblading or skateboarding. 4. Safe sex- If you may be exposed to sexually transmitted infections, use a condom. 5. Seat belts- Seat belts can save your live; always wear one. 6. Smoke/Carbon Monoxide detectors- These detectors need to be installed on the appropriate level of your home. Replace batteries at least once a year. 7. Skin cancer- When out in the sun please cover up and use sunscreen 15 SPF or higher. 8. Violence- If anyone is threatening or hurting you, please tell your healthcare provider.  9. Drink alcohol in moderation- Limit alcohol intake to one drink or less per day. Never drink and drive.

## 2019-04-16 ENCOUNTER — Encounter: Payer: Self-pay | Admitting: Family Medicine

## 2019-06-10 ENCOUNTER — Encounter: Payer: Self-pay | Admitting: Family Medicine

## 2019-06-17 ENCOUNTER — Other Ambulatory Visit: Payer: Self-pay | Admitting: Family Medicine

## 2019-06-17 DIAGNOSIS — M19049 Primary osteoarthritis, unspecified hand: Secondary | ICD-10-CM

## 2019-06-21 ENCOUNTER — Other Ambulatory Visit: Payer: BC Managed Care – PPO

## 2019-06-27 ENCOUNTER — Other Ambulatory Visit (INDEPENDENT_AMBULATORY_CARE_PROVIDER_SITE_OTHER): Payer: BC Managed Care – PPO

## 2019-06-27 ENCOUNTER — Encounter: Payer: Self-pay | Admitting: Family Medicine

## 2019-06-27 ENCOUNTER — Other Ambulatory Visit: Payer: Self-pay

## 2019-06-27 DIAGNOSIS — M19049 Primary osteoarthritis, unspecified hand: Secondary | ICD-10-CM | POA: Diagnosis not present

## 2019-06-27 LAB — C-REACTIVE PROTEIN: CRP: <1 mg/dL (ref 0.5–20.0)

## 2019-06-28 ENCOUNTER — Encounter: Payer: Self-pay | Admitting: Family Medicine

## 2019-06-30 ENCOUNTER — Encounter: Payer: Self-pay | Admitting: Family Medicine

## 2019-06-30 LAB — ANTI-NUCLEAR AB-TITER (ANA TITER): ANA Titer 1: 1:40 {titer} — ABNORMAL HIGH

## 2019-06-30 LAB — ANA: Anti Nuclear Antibody (ANA): POSITIVE — AB

## 2019-07-03 ENCOUNTER — Encounter: Payer: Self-pay | Admitting: Family Medicine

## 2019-07-06 NOTE — Progress Notes (Signed)
Virtual Visit via Video Note  I connected with Brandi Cherry on 07/07/19 at  1:45 PM EST by a video enabled telemedicine application and verified that I am speaking with the correct person using two identifiers.  Location: Patient: Home  Provider: Clinic  This service was conducted via virtual visit.  Both audio and visual tools were used.  The patient was located at home. I was located in my office.  Consent was obtained prior to the virtual visit and is aware of possible charges through their insurance for this visit.  The patient is an established patient.  Dr. Estanislado Pandy, MD conducted the virtual visit and Hazel Sams, PA-C acted as scribe during the service.  Office staff helped with scheduling follow up visits after the service was conducted.     I discussed the limitations of evaluation and management by telemedicine and the availability of in person appointments. The patient expressed understanding and agreed to proceed.  CC: Pain in both hands  History of Present Illness: Patient is a 54 year old female with past medical history of osteoarthritis and fibromyalgia.  She has chronic pain in both hands.  She experiences intermittent joint inflammation, which has progressively been worsening over the past 4 months. She has a right hand dupuytren's contracture, and she has seen a hand surgeon who did not recommend surgery at this time. She declined hand therapy due to the severity of her hand pain currently.  She had lab work drawn by her PCP recently, which revealed a positive ANA and she is concerned about an underlying autoimmune condition.  She has been experiencing intermittent constipation and diarrhea and concerned she may have celiac.   Review of Systems  Constitutional: Negative for fever and malaise/fatigue.  HENT: Negative for congestion.   Eyes: Negative for photophobia, discharge and redness.  Respiratory: Negative for cough, shortness of breath and wheezing.   Cardiovascular:  Negative for chest pain, palpitations and leg swelling.  Gastrointestinal: Positive for constipation. Negative for blood in stool and diarrhea.  Genitourinary: Negative for dysuria and frequency.  Musculoskeletal: Positive for joint pain. Negative for back pain, myalgias and neck pain.  Skin: Negative for rash.  Neurological: Negative for dizziness and headaches.  Endo/Heme/Allergies: Does not bruise/bleed easily.  Psychiatric/Behavioral: Negative for depression and memory loss. The patient is not nervous/anxious and does not have insomnia.       Observations/Objective: Physical Exam  Constitutional: She is oriented to person, place, and time and well-developed, well-nourished, and in no distress.  HENT:  Head: Normocephalic and atraumatic.  Eyes: Conjunctivae are normal.  Pulmonary/Chest: Effort normal.  Neurological: She is alert and oriented to person, place, and time.  Psychiatric: Mood, memory, affect and judgment normal.   Patient reports joint stiffness all day  Patient denies nocturnal pain.  Difficulty dressing/grooming: Denies Difficulty climbing stairs: Denies Difficulty getting out of chair: Denies Difficulty using hands for taps, buttons, cutlery, and/or writing: Reports  Photographs of both hands were reviewed before the patient's virtual appointment and were visualized on webex.   She has left 3rd DIP joint inflammation.   She has complete fist formation of the left hand Incomplete right fist formation.  Dupuytren's contracture right hand   Assessment and Plan: Visit Diagnoses: Primary osteoarthritis of both hands - She has PIP and DIP synovial thickening consistent with osteoarthritis of both hands.  She has an inflammatory component to the underlying osteoarthritis.  She currently has inflammation of the left 3rd DIP joint.  She has incomplete  right fist formation.  She has a right hand dupuytren's contracture at 3rd and 4th digits. She has been evaluated by a  local hand surgeon who did not recommend surgical intervention at this time according to the patient.   Joint protection and muscle strengthening were discussed. We also discussed a list of natural antiinflammatories to start taking.  She was advised to notify us if she develops increased joint pain or joint swelling. We will obtain the following lab work to assess for an underlying cause of the inflammation she is experiencing. She will follow up in 1 month.   Dupuytren's contracture of right hand: She has incomplete fist formation and limited extension of the right 3rd and 4th digits. She has been evaluated by a hand surgeon who did not recommend surgery at this time. She declined physical therapy in the past due to the severity of her pain.  We highly recommended hand therapy.   Positive ANA (antinuclear antibody): 06/27/19: ANA 1:40 cytoplasmic, CRP <1: We discussed the insignificance of the low titer ANA result.  She would like a thorough workup to rule out an underlying autoimmune condition. We will obtain the following labs: ENA panel, RF, CCP, ESR, ttg, gliadin.    Adhesive capsulitis of both shoulders: Doing better.   Other fatigue: Due to insomnia  Primary insomnia: Good sleep hygiene discussed.  Fibromyalgia: She does experience generalized pain and discomfort. Need for regular exercise was discussed.  Vitamin D deficiency: She is taking a calcium and vitamin D supplement.   Dry mouth : Controlled with over-the-counter products.  Follow Up Instructions: She will follow up in 1 month   I discussed the assessment and treatment plan with the patient. The patient was provided an opportunity to ask questions and all were answered. The patient agreed with the plan and demonstrated an understanding of the instructions.   The patient was advised to call back or seek an in-person evaluation if the symptoms worsen or if the condition fails to improve as anticipated.  I provided 30  minutes of non-face-to-face time during this encounter.   Bo Merino, MD   Scribed by-  Hazel Sams, PA-C

## 2019-07-07 ENCOUNTER — Other Ambulatory Visit: Payer: Self-pay

## 2019-07-07 ENCOUNTER — Telehealth (INDEPENDENT_AMBULATORY_CARE_PROVIDER_SITE_OTHER): Payer: BC Managed Care – PPO | Admitting: Rheumatology

## 2019-07-07 ENCOUNTER — Encounter: Payer: Self-pay | Admitting: Rheumatology

## 2019-07-07 DIAGNOSIS — E559 Vitamin D deficiency, unspecified: Secondary | ICD-10-CM

## 2019-07-07 DIAGNOSIS — R682 Dry mouth, unspecified: Secondary | ICD-10-CM

## 2019-07-07 DIAGNOSIS — M7501 Adhesive capsulitis of right shoulder: Secondary | ICD-10-CM | POA: Diagnosis not present

## 2019-07-07 DIAGNOSIS — M19041 Primary osteoarthritis, right hand: Secondary | ICD-10-CM

## 2019-07-07 DIAGNOSIS — M720 Palmar fascial fibromatosis [Dupuytren]: Secondary | ICD-10-CM

## 2019-07-07 DIAGNOSIS — R5383 Other fatigue: Secondary | ICD-10-CM

## 2019-07-07 DIAGNOSIS — M79642 Pain in left hand: Secondary | ICD-10-CM

## 2019-07-07 DIAGNOSIS — M79641 Pain in right hand: Secondary | ICD-10-CM

## 2019-07-07 DIAGNOSIS — M797 Fibromyalgia: Secondary | ICD-10-CM

## 2019-07-07 DIAGNOSIS — F5101 Primary insomnia: Secondary | ICD-10-CM

## 2019-07-07 DIAGNOSIS — R768 Other specified abnormal immunological findings in serum: Secondary | ICD-10-CM

## 2019-07-07 DIAGNOSIS — M7502 Adhesive capsulitis of left shoulder: Secondary | ICD-10-CM

## 2019-07-07 DIAGNOSIS — M19042 Primary osteoarthritis, left hand: Secondary | ICD-10-CM

## 2019-07-11 ENCOUNTER — Encounter: Payer: Self-pay | Admitting: Rheumatology

## 2019-07-11 DIAGNOSIS — R768 Other specified abnormal immunological findings in serum: Secondary | ICD-10-CM

## 2019-07-11 DIAGNOSIS — M720 Palmar fascial fibromatosis [Dupuytren]: Secondary | ICD-10-CM

## 2019-07-11 NOTE — Telephone Encounter (Signed)
Patient may get AVISE labs which should cover everything.  She can get ESR, anti-tTG antigliadin antibodies we are today or at the [facility] lab tomorrow.  She can pick up the lab slip.

## 2019-07-21 LAB — TISSUE TRANSGLUTAMINASE, IGA: (tTG) Ab, IgA: 1 U/mL

## 2019-07-29 NOTE — Progress Notes (Signed)
Office Visit Note  Patient: Brandi Cherry             Date of Birth: [birth year]           MRN: [id number]             PCP: Martinique, Betty G, MD Referring: Martinique, Betty G, MD Visit Date: 08/08/2019 Occupation: @GUAROCC @  Subjective:  Dry mouth, pain in both hands.   History of Present Illness: Brandi Cherry is a 54 y.o. female with history of osteoarthritis and sicca symptoms.  She states she continues to have dry mouth and dry eyes.  She has some dry skin.  She has pain and swelling in her right hand.  Which she describes over MCP joints.  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  She has some discomfort in her left first DIP joint.  None of the other joints are painful.  She states she also has some intermittent diarrhea and epigastric pain.  She had recent colonoscopy which was benign except for some polyps which were removed.  Activities of Daily Living:  Patient reports morning stiffness for 24 hours.   Patient Reports nocturnal pain.  Difficulty dressing/grooming: Denies Difficulty climbing stairs: Denies Difficulty getting out of chair: Denies Difficulty using hands for taps, buttons, cutlery, and/or writing: Reports  Review of Systems  Constitutional: Negative for fatigue, night sweats, weight gain and weight loss.  HENT: Positive for mouth dryness and nose dryness. Negative for mouth sores, trouble swallowing and trouble swallowing.   Eyes: Positive for dryness. Negative for pain, redness and visual disturbance.  Respiratory: Negative for cough, shortness of breath and difficulty breathing.   Cardiovascular: Negative for chest pain, palpitations, hypertension, irregular heartbeat and swelling in legs/feet.  Gastrointestinal: Positive for diarrhea. Negative for blood in stool and constipation.  Endocrine: Negative for increased urination.  Genitourinary: Negative for difficulty urinating, painful urination and vaginal dryness.  Musculoskeletal: Positive for  arthralgias, joint pain, joint swelling and morning stiffness. Negative for myalgias, muscle weakness, muscle tenderness and myalgias.  Skin: Negative for color change, rash, hair loss, skin tightness, ulcers and sensitivity to sunlight.  Allergic/Immunologic: Negative for susceptible to infections.  Neurological: Positive for headaches. Negative for dizziness, memory loss, night sweats and weakness.  Hematological: Positive for bruising/bleeding tendency. Negative for swollen glands.  Psychiatric/Behavioral: Positive for sleep disturbance. Negative for depressed mood and confusion. The patient is not nervous/anxious.     PMFS History:  Patient Active Problem List   Diagnosis Date Noted  . Muscle fasciculation 03/30/2018  . OA (osteoarthritis) of finger, left 10/12/2017  . Ptosis, left eyelid 12/25/2016  . Acquired deformity of toenail 12/25/2016  . Tremor 10/11/2016  . Paresthesia 10/11/2016  . Gastroesophageal reflux disease 08/02/2016  . 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 Vasovagal episode 07/31/2016  . Benign paroxysmal positional vertigo of left ear 07/07/2016  . Enlarged tonsils 01/09/2015  . Dry mouth 12/29/2014  . Heel pain, bilateral 02/23/2014  . 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 Paroxysmal atrial tachycardia ([place]) 07/21/2012  . Lower back pain 11/11/2011  . Hx of migraines   . Vertigo   . HSV-2 (herpes simplex virus 2) infection   . Melanoma in situ of face ([place])   . History of UTI     Past Medical History:  Diagnosis Date  . Adhesive capsulitis of shoulder 2014   s/p GH steroid injection  .  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 Teena Dunk at [facility])  . Benign fasciculations    over 2 years  . 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 Hemorrhoids    has had since childbirth  . History of miscarriage    G5P2  .  History of UTI    tends to get with relations  . 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 Hx of migraines    with cycle  . Melanoma in situ of face [facility])    gets yearly skin checks [name])  . 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 Post-operative nausea and vomiting   . Urine incontinence   . Vertigo     Family History  Problem Relation Age of Onset  . Thyroid disease Mother 39  . Neuropathy Mother   . Coronary artery disease Father 2       MI  . Hypertension Father   . Cancer Maternal Uncle        unsure  . Mental retardation Maternal Grandmother        bipolar  . Arthritis Other        strong on mother's side, no RA though  . Asthma Son   . Colon cancer Maternal Grandfather   . Diabetes Neg Hx   . Rectal cancer Neg Hx    Past Surgical History:  Procedure Laterality Date  . BASAL CELL CARCINOMA EXCISION     forehead, left arm  . CARDIAC ELECTROPHYSIOLOGY STUDY AND ABLATION  04/2013   AVNRT (EP Dr. [facility]ena [facility]unk at [facility])  . DILATION AND CURETTAGE OF UTERUS  05/2013   uterine polyp with menorrhagia  . hospitalization  2002   urosepsis, again with son   Social History   Social History Narrative   Caffeine: occasional tea   Lives with husband, son and daughter   Occupation; Animal nutritionist at [employer]   Edu: [employer]   Activity: none currently   Diet: good water, daily fruits/vegetables, red meat 1x/wk, fish 2x/wk   Immunization History  Administered Date(s) Administered  . Tdap 07/16/2011     Objective: Vital Signs: BP 125/82 (BP Location: Left Arm, Patient Position: Sitting, Cuff Size: Normal)   Pulse (!) 110   Resp 13   Ht 5\' 6"  (1.676 m)   Wt 171 lb (77.6 kg)   LMP 06/01/2016 Comment: started bleeding on Friday 06/12/17  BMI 27.60 kg/m    Physical Exam Vitals and nursing note reviewed.  Constitutional:      Appearance: She is well-developed.  HENT:     Head: Normocephalic and  atraumatic.  Eyes:     Conjunctiva/sclera: Conjunctivae normal.  Cardiovascular:     Rate and Rhythm: Normal rate and regular rhythm.     Heart sounds: Normal heart sounds.  Pulmonary:     Effort: Pulmonary effort is normal.     Breath sounds: Normal breath sounds.  Abdominal:     General: Bowel sounds are normal.     Palpations: Abdomen is soft.  Musculoskeletal:     Cervical back: Normal range of motion.  Lymphadenopathy:     Cervical: No cervical adenopathy.  Skin:    General: Skin is warm and dry.     Capillary Refill: Capillary refill takes less than 2 seconds.  Neurological:  Mental Status: She is alert and oriented to person, place, and time.  Psychiatric:        Behavior: Behavior normal.      Musculoskeletal Exam: C-spine thoracic and lumbar spine were in good range of motion.  Shoulder joints elbow joints wrist joints with good range of motion.  She is in complete fist formation with the right hand.  PIP and DIP thickening was noted bilaterally.  Dupuytren's contractures were noted in her right third fourth and fifth digits.  Hip joints, knee joints, elbow ankles were in good range of motion with no synovitis.  She has crepitus in the bilateral knee joints.  CDAI Exam: CDAI Score: -- Patient Global: --; Provider Global: -- Swollen: --; Tender: -- Joint Exam 08/08/2019   No joint exam has been documented for this visit   There is currently no information documented on the homunculus. Go to the Rheumatology activity and complete the homunculus joint exam.  Investigation: No additional findings.  Imaging: XR Hand 2 View Left  Result Date: 08/08/2019 PIP and DIP narrowing was noted.  Mild CMC narrowing was noted.  No MCP, intercarpal radiocarpal joint space narrowing was noted.  No interval change was noted from the x-rays of April 13, 2017. Impression: These findings are consistent with osteoarthritis of the hand.  XR Hand 2 View Right  Result Date:  08/08/2019 PIP and DIP narrowing was noted.  Mild CMC narrowing was noted.  No MCP, intercarpal radiocarpal joint space narrowing was noted.  No interval change was noted from the x-rays of April 13, 2017. Impression: These findings are consistent with osteoarthritis of the hand.   Recent Labs: Lab Results  Component Value Date   WBC 4.3 03/30/2018   HGB 13.1 03/30/2018   PLT 244.0 03/30/2018   NA 138 04/15/2019   K 4.1 04/15/2019   CL 104 04/15/2019   CO2 25 04/15/2019   GLUCOSE 90 04/15/2019   BUN 7 04/15/2019   CREATININE 0.63 04/15/2019   BILITOT 0.4 03/30/2018   ALKPHOS 60 03/30/2018   AST 24 03/30/2018   ALT 18 03/30/2018   PROT 6.9 03/30/2018   ALBUMIN 4.2 03/30/2018   CALCIUM 8.9 04/15/2019   GFRAA >60 07/24/2017   January 21, 2021Avise lupus index -1.2, ANA titer negative, antihistone weak positive rest of the ENA negative, CB CAP negative, RF negative, anti-CCP negative, beta-2 negative, anticardiolipin negative, TPO and antithyroglobulin negative Speciality Comments: No specialty comments available.  Procedures:  No procedures performed Allergies: Pamelor [nortriptyline hcl]   Assessment / Plan:     Visit Diagnoses: Positive ANA (antinuclear antibody) - 06/27/19: ANA 1:40 cytoplasmic, CRP <1: We discussed the insignificance of the low titer ANA result.  All other antibodies are negative.  Sicca syndrome-she complains of dry mouth and dry eyes consistent with sicca symptoms.  I offered her the use of ParaGard pain but she declined.  Dupuytren's contracture of right hand-she has contractures in her right third fourth and fifth fingers.  Which limits the extension of her hand.  Primary osteoarthritis of both hands-she has DIP and PIP thickening and incomplete fist formation bilaterally more prominent in her right hand.  She has noticed worsening of range of motion of her fingers.  I will obtain x-ray of her bilateral hands today for comparison.  X-rays obtained today  were consistent with osteoarthritis.  No radiographic progression was noted when compared to the films of two thousand eighteen.  I reviewed the x-ray findings with the patient.  A handout  on hand exercises was given.  I will obtain ultrasound of bilateral hands to look for synovitis.  Adhesive capsulitis of both shoulders-she has done quite well.  Patient gives history of recurrent diarrhea.  She had GI work-up including colonoscopy which was unremarkable.  Fibromyalgia-she has hyperalgesia.  Other fatigue-related to insomnia.  Primary insomnia  Vitamin D deficiency  Pain in both hands - Plan: XR Hand 2 View Right, XR Hand 2 View Left  Orders: Orders Placed This Encounter  Procedures  . XR Hand 2 View Right  . XR Hand 2 View Left   No orders of the defined types were placed in this encounter.     Follow-Up Instructions: Return in about 6 months (around 02/05/2020) for Osteoarthritis, sicca symptoms.   Bo Merino, MD  Note - This record has been created using Editor, commissioning.  Chart creation errors have been sought, but may not always  have been located. Such creation errors do not reflect on  the standard of medical care.

## 2019-08-08 ENCOUNTER — Ambulatory Visit: Payer: BC Managed Care – PPO | Admitting: Rheumatology

## 2019-08-08 ENCOUNTER — Ambulatory Visit: Payer: Self-pay

## 2019-08-08 ENCOUNTER — Encounter: Payer: Self-pay | Admitting: Rheumatology

## 2019-08-08 ENCOUNTER — Other Ambulatory Visit: Payer: Self-pay

## 2019-08-08 VITALS — BP 125/82 | HR 110 | Resp 13 | Ht 66.0 in | Wt 171.0 lb

## 2019-08-08 DIAGNOSIS — M7502 Adhesive capsulitis of left shoulder: Secondary | ICD-10-CM

## 2019-08-08 DIAGNOSIS — M720 Palmar fascial fibromatosis [Dupuytren]: Secondary | ICD-10-CM

## 2019-08-08 DIAGNOSIS — M797 Fibromyalgia: Secondary | ICD-10-CM

## 2019-08-08 DIAGNOSIS — M3500 Sicca syndrome, unspecified: Secondary | ICD-10-CM | POA: Diagnosis not present

## 2019-08-08 DIAGNOSIS — M79642 Pain in left hand: Secondary | ICD-10-CM | POA: Diagnosis not present

## 2019-08-08 DIAGNOSIS — Z87898 Personal history of other specified conditions: Secondary | ICD-10-CM

## 2019-08-08 DIAGNOSIS — M79641 Pain in right hand: Secondary | ICD-10-CM

## 2019-08-08 DIAGNOSIS — M19041 Primary osteoarthritis, right hand: Secondary | ICD-10-CM

## 2019-08-08 DIAGNOSIS — M7501 Adhesive capsulitis of right shoulder: Secondary | ICD-10-CM

## 2019-08-08 DIAGNOSIS — M19042 Primary osteoarthritis, left hand: Secondary | ICD-10-CM

## 2019-08-08 DIAGNOSIS — F5101 Primary insomnia: Secondary | ICD-10-CM

## 2019-08-08 DIAGNOSIS — R768 Other specified abnormal immunological findings in serum: Secondary | ICD-10-CM | POA: Diagnosis not present

## 2019-08-08 DIAGNOSIS — E559 Vitamin D deficiency, unspecified: Secondary | ICD-10-CM

## 2019-08-08 DIAGNOSIS — R5383 Other fatigue: Secondary | ICD-10-CM

## 2019-08-08 NOTE — Patient Instructions (Signed)
Hand Exercises Hand exercises can be helpful for almost anyone. These exercises can strengthen the hands, improve flexibility and movement, and increase blood flow to the hands. These results can make work and daily tasks easier. Hand exercises can be especially helpful for people who have joint pain from arthritis or have nerve damage from overuse (carpal tunnel syndrome). These exercises can also help people who have injured a hand. Exercises Most of these hand exercises are gentle stretching and motion exercises. It is usually safe to do them often throughout the day. Warming up your hands before exercise may help to reduce stiffness. You can do this with gentle massage or by placing your hands in warm water for 10-15 minutes. It is normal to feel some stretching, pulling, tightness, or mild discomfort as you begin new exercises. This will gradually improve. Stop an exercise right away if you feel sudden, severe pain or your pain gets worse. Ask your health care provider which exercises are best for you. Knuckle bend or "claw" fist 1. Stand or sit with your arm, hand, and all five fingers pointed straight up. Make sure to keep your wrist straight during the exercise. 2. Gently bend your fingers down toward your palm until the tips of your fingers are touching the top of your palm. Keep your big knuckle straight and just bend the small knuckles in your fingers. 3. Hold this position for __________ seconds. 4. Straighten (extend) your fingers back to the starting position. Repeat this exercise 5-10 times with each hand. Full finger fist 1. Stand or sit with your arm, hand, and all five fingers pointed straight up. Make sure to keep your wrist straight during the exercise. 2. Gently bend your fingers into your palm until the tips of your fingers are touching the middle of your palm. 3. Hold this position for __________ seconds. 4. Extend your fingers back to the starting position, stretching every  joint fully. Repeat this exercise 5-10 times with each hand. Straight fist 1. Stand or sit with your arm, hand, and all five fingers pointed straight up. Make sure to keep your wrist straight during the exercise. 2. Gently bend your fingers at the big knuckle, where your fingers meet your hand, and the middle knuckle. Keep the knuckle at the tips of your fingers straight and try to touch the bottom of your palm. 3. Hold this position for __________ seconds. 4. Extend your fingers back to the starting position, stretching every joint fully. Repeat this exercise 5-10 times with each hand. Tabletop 1. Stand or sit with your arm, hand, and all five fingers pointed straight up. Make sure to keep your wrist straight during the exercise. 2. Gently bend your fingers at the big knuckle, where your fingers meet your hand, as far down as you can while keeping the small knuckles in your fingers straight. Think of forming a tabletop with your fingers. 3. Hold this position for __________ seconds. 4. Extend your fingers back to the starting position, stretching every joint fully. Repeat this exercise 5-10 times with each hand. Finger spread 1. Place your hand flat on a table with your palm facing down. Make sure your wrist stays straight as you do this exercise. 2. Spread your fingers and thumb apart from each other as far as you can until you feel a gentle stretch. Hold this position for __________ seconds. 3. Bring your fingers and thumb tight together again. Hold this position for __________ seconds. Repeat this exercise 5-10 times with each hand.   Making circles 1. Stand or sit with your arm, hand, and all five fingers pointed straight up. Make sure to keep your wrist straight during the exercise. 2. Make a circle by touching the tip of your thumb to the tip of your index finger. 3. Hold for __________ seconds. Then open your hand wide. 4. Repeat this motion with your thumb and each finger on your  hand. Repeat this exercise 5-10 times with each hand. Thumb motion 1. Sit with your forearm resting on a table and your wrist straight. Your thumb should be facing up toward the ceiling. Keep your fingers relaxed as you move your thumb. 2. Lift your thumb up as high as you can toward the ceiling. Hold for __________ seconds. 3. Bend your thumb across your palm as far as you can, reaching the tip of your thumb for the small finger (pinkie) side of your palm. Hold for __________ seconds. Repeat this exercise 5-10 times with each hand. Grip strengthening  1. Hold a stress ball or other soft ball in the middle of your hand. 2. Slowly increase the pressure, squeezing the ball as much as you can without causing pain. Think of bringing the tips of your fingers into the middle of your palm. All of your finger joints should bend when doing this exercise. 3. Hold your squeeze for __________ seconds, then relax. Repeat this exercise 5-10 times with each hand. Contact a health care provider if:  Your hand pain or discomfort gets much worse when you do an exercise.  Your hand pain or discomfort does not improve within 2 hours after you exercise. If you have any of these problems, stop doing these exercises right away. Do not do them again unless your health care provider says that you can. Get help right away if:  You develop sudden, severe hand pain or swelling. If this happens, stop doing these exercises right away. Do not do them again unless your health care provider says that you can. This information is not intended to replace advice given to you by your health care provider. Make sure you discuss any questions you have with your health care provider. Document Revised: 10/07/2018 Document Reviewed: 06/17/2018 Elsevier Patient Education  2020 Elsevier Inc.  

## 2019-08-10 ENCOUNTER — Encounter: Payer: Self-pay | Admitting: Family Medicine

## 2019-08-21 ENCOUNTER — Encounter: Payer: Self-pay | Admitting: Rheumatology

## 2019-08-21 ENCOUNTER — Ambulatory Visit: Payer: BC Managed Care – PPO

## 2019-08-24 ENCOUNTER — Other Ambulatory Visit: Payer: BC Managed Care – PPO | Admitting: Rheumatology

## 2019-08-25 ENCOUNTER — Ambulatory Visit: Payer: BC Managed Care – PPO

## 2019-09-17 ENCOUNTER — Encounter: Payer: Self-pay | Admitting: Rheumatology

## 2019-09-21 ENCOUNTER — Ambulatory Visit: Payer: BC Managed Care – PPO | Admitting: Physician Assistant

## 2019-09-21 ENCOUNTER — Other Ambulatory Visit (INDEPENDENT_AMBULATORY_CARE_PROVIDER_SITE_OTHER): Payer: BC Managed Care – PPO

## 2019-09-21 ENCOUNTER — Encounter: Payer: Self-pay | Admitting: Physician Assistant

## 2019-09-21 ENCOUNTER — Other Ambulatory Visit: Payer: Self-pay

## 2019-09-21 VITALS — BP 124/74 | HR 90 | Temp 97.9°F | Ht 66.0 in | Wt 166.0 lb

## 2019-09-21 DIAGNOSIS — R1013 Epigastric pain: Secondary | ICD-10-CM

## 2019-09-21 DIAGNOSIS — R110 Nausea: Secondary | ICD-10-CM

## 2019-09-21 DIAGNOSIS — R634 Abnormal weight loss: Secondary | ICD-10-CM

## 2019-09-21 DIAGNOSIS — R194 Change in bowel habit: Secondary | ICD-10-CM | POA: Diagnosis not present

## 2019-09-21 LAB — CBC WITH DIFFERENTIAL/PLATELET
Basophils Absolute: 0 10*3/uL (ref 0.0–0.1)
Basophils Relative: 0.5 % (ref 0.0–3.0)
Eosinophils Absolute: 0.1 10*3/uL (ref 0.0–0.7)
Eosinophils Relative: 2.5 % (ref 0.0–5.0)
HCT: 42.7 % (ref 36.0–46.0)
Hemoglobin: 14.4 g/dL (ref 12.0–15.0)
Lymphocytes Relative: 25.1 % (ref 12.0–46.0)
Lymphs Abs: 0.9 10*3/uL (ref 0.7–4.0)
MCHC: 33.8 g/dL (ref 30.0–36.0)
MCV: 86.7 fl (ref 78.0–100.0)
Monocytes Absolute: 0.4 10*3/uL (ref 0.1–1.0)
Monocytes Relative: 9.7 % (ref 3.0–12.0)
Neutro Abs: 2.3 10*3/uL (ref 1.4–7.7)
Neutrophils Relative %: 62.2 % (ref 43.0–77.0)
Platelets: 230 10*3/uL (ref 150.0–400.0)
RBC: 4.92 Mil/uL (ref 3.87–5.11)
RDW: 13.5 % (ref 11.5–15.5)
WBC: 3.7 10*3/uL — ABNORMAL LOW (ref 4.0–10.5)

## 2019-09-21 LAB — COMPREHENSIVE METABOLIC PANEL
ALT: 10 U/L (ref 0–35)
AST: 17 U/L (ref 0–37)
Albumin: 4.5 g/dL (ref 3.5–5.2)
Alkaline Phosphatase: 64 U/L (ref 39–117)
BUN: 7 mg/dL (ref 6–23)
CO2: 28 mEq/L (ref 19–32)
Calcium: 9.1 mg/dL (ref 8.4–10.5)
Chloride: 104 mEq/L (ref 96–112)
Creatinine, Ser: 0.64 mg/dL (ref 0.40–1.20)
GFR: 97.17 mL/min (ref >60.00)
Glucose, Bld: 85 mg/dL (ref 70–99)
Potassium: 3.9 mEq/L (ref 3.5–5.1)
Sodium: 137 mEq/L (ref 135–145)
Total Bilirubin: 0.5 mg/dL (ref 0.2–1.2)
Total Protein: 7.5 g/dL (ref 6.0–8.3)

## 2019-09-21 LAB — SEDIMENTATION RATE: Sed Rate: 14 mm/hr (ref 0–30)

## 2019-09-21 MED ORDER — PANTOPRAZOLE SODIUM 40 MG PO TBEC: 40.0000 mg | DELAYED_RELEASE_TABLET | Freq: Every day | ORAL | 3 refills | Status: DC

## 2019-09-21 MED ORDER — GLYCOPYRROLATE 2 MG PO TABS: 2.0000 mg | ORAL_TABLET | Freq: Two times a day (BID) | ORAL | 4 refills | Status: DC

## 2019-09-21 NOTE — Patient Instructions (Signed)
Your provider has requested that you go to the basement level for lab work before leaving today. Press "B" on the elevator. The lab is located at the first door on the left as you exit the elevator.  We have sent the following medications to your pharmacy for you to pick up at your convenience:  Protonix, Robinul Forte  You have been scheduled for a CT scan of the abdomen and pelvis at [facility] are scheduled on 10/11/2019 at 11:30am. You should arrive 15 minutes prior to your appointment time for registration. Please follow the written instructions below on the day of your exam:  WARNING: IF YOU ARE ALLERGIC TO IODINE/X-RAY DYE, PLEASE NOTIFY RADIOLOGY IMMEDIATELY AT [id number]! YOU WILL BE GIVEN A 13 HOUR PREMEDICATION PREP.  1) Do not eat or drink anything after 7:30am (4 hours prior to your test) 2) You have been given 2 bottles of oral contrast to drink. The solution may taste better if refrigerated, but do NOT add ice or any other liquid to this solution. Shake well before drinking.    Drink 1 bottle of contrast @ 9:30am (2 hours prior to your exam)  Drink 1 bottle of contrast @ 10:30am (1 hour prior to your exam)  You may take any medications as prescribed with a small amount of water, if necessary. If you take any of the following medications: METFORMIN, GLUCOPHAGE, GLUCOVANCE, AVANDAMET, RIOMET, FORTAMET, [place] MET, JANUMET, GLUMETZA or METAGLIP, you MAY be asked to HOLD this medication 48 hours AFTER the exam.  The purpose of you drinking the oral contrast is to aid in the visualization of your intestinal tract. The contrast solution may cause some diarrhea. Depending on your individual set of symptoms, you may also receive an intravenous injection of x-ray contrast/dye. Plan on being at [facility] for 30 minutes or longer, depending on the type of exam you are having performed.  This test typically takes 30-45 minutes to complete.  If you have any questions  regarding your exam or if you need to reschedule, you may call the CT department at [phone] between the hours of 8:00 am and 5:00 pm, Monday-Friday.  ________________________________________________________________________

## 2019-09-21 NOTE — Progress Notes (Signed)
Subjective:    Patient ID: Brandi Cherry, female    DOB: 04-05-1966, 54 y.o.   MRN: [id number]  HPI Brandi Cherry is a pleasant 54 year old female, known to Dr. Silverio Cherry from recent colonoscopy done in July 2020.  She comes in today with 68-monthhistory of upper abdominal discomfort described as gnawing in nature and associated with frequent nausea and decrease in appetite.  She has had a 7 pound weight loss in the past couple of weeks unintentional.  She has been eating very bland.  She has not noticed any change in her symptoms with or without p.o. intake.  She describes a lot of gas belching burping some bloating, and has had some change in bowel habits with occasional loose stools and urgency sometimes associated with abdominal cramping.  Her usual pattern had been to have a bowel movement about every 2 days.  She has not noticed any blood. No regular aspirin or NSAIDs.  Overall just not feeling well. Colonoscopy July 2022 , with a 3 mm and 9 mm polyp removed, path consistent with tubular adenomas and noted internal hemorrhoids. She has past history of PAT, osteoarthritis and a facial melanoma.  Review of Systems Pertinent positive and negative review of systems were noted in the above HPI section.  All other review of systems was otherwise negative.  Outpatient Encounter Medications as of 09/21/2019  Medication Sig  . CALCIUM PO Take by mouth. Calcium and vit d chewable-Chew 2 daily  . COMBIPATCH 0.05-0.14 MG/DAY APPLY 1 PATCH TWICE A WEEK AS DIRECTED FOR 30 DAYS  . diclofenac Sodium (VOLTAREN) 1 % GEL Apply topically 4 (four) times daily.  . Multiple Vitamin (MULTIVITAMIN) tablet Take 1 tablet by mouth daily.  . valACYclovir (VALTREX) 500 MG tablet Take 500 mg by mouth daily as needed.   .Marland Kitchenglycopyrrolate (ROBINUL) 2 MG tablet Take 1 tablet (2 mg total) by mouth 2 (two) times daily.  . pantoprazole (PROTONIX) 40 MG tablet Take 1 tablet (40 mg total) by mouth daily.   Facility-Administered  Encounter Medications as of 09/21/2019  Medication  . diclofenac sodium (VOLTAREN) 1 % transdermal gel 2 g   Allergies  Allergen Reactions  . Pamelor [Nortriptyline Hcl] Other (See Comments)    fasciculations   Patient Active Problem List   Diagnosis Date Noted  . Muscle fasciculation 03/30/2018  . OA (osteoarthritis) of finger, left 10/12/2017  . Ptosis, left eyelid 12/25/2016  . Acquired deformity of toenail 12/25/2016  . Tremor 10/11/2016  . Paresthesia 10/11/2016  . Gastroesophageal reflux disease 08/02/2016  . 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 Vasovagal episode 07/31/2016  . Benign paroxysmal positional vertigo of left ear 07/07/2016  . Enlarged tonsils 01/09/2015  . Dry mouth 12/29/2014  . Heel pain, bilateral 02/23/2014  . 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 AVNRT (AV nodal re-entry tachycardia) ([place] 03/17/2013  . 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 Paroxysmal atrial tachycardia ([facility] 07/21/2012  . Lower back pain 11/11/2011  . Hx of migraines   . Vertigo   . HSV-2 (herpes simplex virus 2) infection   . Melanoma in situ of face ([facility]   . History of UTI    Social History   Socioeconomic History  . Marital status: Married    Spouse name: Not on file  . Number of children: 2  . Years of education: masters  . Highest education level: Not on file  Occupational History  . Occupation: cNurse, adult [employer] Tobacco Use  . Smoking  status: Never Smoker  . Smokeless tobacco: Never Used  Substance and Sexual Activity  . Alcohol use: No    Alcohol/week: 0.0 standard drinks  . Drug use: No  . Sexual activity: Not on file  Other Topics Concern  . Not on file  Social History Narrative   Caffeine: occasional tea   Lives with husband, son and daughter   Occupation; Animal nutritionist at [employer]   Edu: Masters   Activity: none currently   Diet: good water, daily fruits/vegetables, red meat 1x/wk, fish 2x/wk   Social Determinants of Health    Financial Resource Strain:   . Difficulty of Paying Living Expenses:   Food Insecurity:   . Worried About Charity fundraiser in the Last Year:   . Arboriculturist in the Last Year:   Transportation Needs:   . Film/video editor (Medical):   Marland Kitchen Lack of Transportation (Non-Medical):   Physical Activity:   . Days of Exercise per Week:   . Minutes of Exercise per Session:   Stress:   . Feeling of Stress :   Social Connections:   . Frequency of Communication with Friends and Family:   . Frequency of Social Gatherings with Friends and Family:   . Attends Religious Services:   . Active Member of Clubs or Organizations:   . Attends Archivist Meetings:   Marland Kitchen Marital Status:   Intimate Partner Violence:   . Fear of Current or Ex-Partner:   . Emotionally Abused:   Marland Kitchen Physically Abused:   . Sexually Abused:     Ms. Brandi Cherry family history includes Arthritis in an other family member; Asthma in her son; Cancer in her maternal uncle; Colon cancer in her maternal grandfather; Coronary artery disease (age of onset: 84) in her father; Hypertension in her father; Mental retardation in her maternal grandmother; Neuropathy in her mother; Thyroid disease (age of onset: 55) in her mother.      Objective:    Vitals:   09/21/19 1133  BP: 124/74  Pulse: 90  Temp: 97.9 F (36.6 C)    Physical Exam Well-developed well-nourished female in no acute distress.  Height, Weight, 166 BMI 26.7  HEENT; nontraumatic normocephalic, EOMI, PER R LA, sclera anicteric. Oropharynx; not examined Neck; supple, no JVD Cardiovascular; regular rate and rhythm with S1-S2, no murmur rub or gallop Pulmonary; Clear bilaterally Abdomen; soft, tender in the epigastrium and hypogastrium , nondistended, no palpable mass or hepatosplenomegaly, bowel sounds are active Rectal; not done today Skin; benign exam, no jaundice rash or appreciable lesions Extremities; no clubbing cyanosis or edema skin warm and  dry Neuro/Psych; alert and oriented x4, grossly nonfocal mood and affect appropriate       Assessment & Plan:   #80 55 year old white female with 3-monthhistory of upper abdominal pain/described as gnawing and associated with frequent nausea without vomiting, decrease in appetite, increase in abdominal bloating gas and belching.  She has lost about 7 pounds in the past 2 weeks due to decreased intake.  Etiology of current symptoms not clear, rule out gastritis, peptic ulcer disease, occult intra-abdominal inflammatory process, IBD  #2 history of adenomatous colon polyps-up-to-date with colonoscopy just done July 2020 #3 history of PAT #4 osteoarthritis #5 prior melanoma/face  Plan; start trial of Protonix 40 mg p.o. every morning AC breakfast Start Robinul Forte 2 mg p.o. twice daily CBC with differential, c-Met and sed rate Schedule for CT scan of the abdomen and pelvis with  contrast. If CT is unrevealing she may need EGD with Dr. Ancil Linsey PA-C 09/21/2019   Cc: Martinique, Betty G, MD

## 2019-09-23 ENCOUNTER — Telehealth: Payer: Self-pay

## 2019-09-23 NOTE — Progress Notes (Signed)
Reviewed and agree with documentation and assessment and plan. K. Veena Ami Mally , MD   

## 2019-09-23 NOTE — Telephone Encounter (Signed)
Hey Brandi Cherry, I wasn't sure you had even had time to review her labs.  Just wanted to let you know she had a question.

## 2019-09-27 NOTE — Telephone Encounter (Signed)
Nurse will call with results 

## 2019-10-11 ENCOUNTER — Ambulatory Visit (HOSPITAL_COMMUNITY): Payer: BC Managed Care – PPO

## 2019-10-11 ENCOUNTER — Other Ambulatory Visit: Payer: Self-pay

## 2019-10-11 ENCOUNTER — Telehealth: Payer: Self-pay | Admitting: Physician Assistant

## 2019-10-11 DIAGNOSIS — R1084 Generalized abdominal pain: Secondary | ICD-10-CM

## 2019-10-11 NOTE — Telephone Encounter (Signed)
I have called Ms. Ofori and have her rescheduled to 10/14/19. Reviewed her instructions. The insurance authorization is still good.

## 2019-10-14 ENCOUNTER — Other Ambulatory Visit: Payer: Self-pay

## 2019-10-14 ENCOUNTER — Telehealth: Payer: Self-pay | Admitting: Physician Assistant

## 2019-10-14 ENCOUNTER — Ambulatory Visit (HOSPITAL_COMMUNITY)
Admission: RE | Admit: 2019-10-14 | Discharge: 2019-10-14 | Disposition: A | Discharge status: Home / Self Care | Payer: BC Managed Care – PPO | Source: Ambulatory Visit | Attending: Physician Assistant | Admitting: Physician Assistant

## 2019-10-14 DIAGNOSIS — R1084 Generalized abdominal pain: Secondary | ICD-10-CM

## 2019-10-14 NOTE — Telephone Encounter (Signed)
Called her back to discuss options. No answer. I lef there a voicemail. I do not know what her deductible is or who the insurance participates with. Left on her voicemail the suggestion her insurance may be able to help her find a less expensive location for the imaging.

## 2019-10-24 ENCOUNTER — Other Ambulatory Visit: Payer: Self-pay

## 2019-10-24 DIAGNOSIS — D72828 Other elevated white blood cell count: Secondary | ICD-10-CM

## 2020-01-09 ENCOUNTER — Telehealth (INDEPENDENT_AMBULATORY_CARE_PROVIDER_SITE_OTHER): Payer: BC Managed Care – PPO | Admitting: Family Medicine

## 2020-01-09 ENCOUNTER — Encounter: Payer: Self-pay | Admitting: Family Medicine

## 2020-01-09 VITALS — Ht 66.0 in

## 2020-01-09 DIAGNOSIS — R059 Cough, unspecified: Secondary | ICD-10-CM

## 2020-01-09 DIAGNOSIS — R05 Cough: Secondary | ICD-10-CM | POA: Diagnosis not present

## 2020-01-09 DIAGNOSIS — H6983 Other specified disorders of Eustachian tube, bilateral: Secondary | ICD-10-CM | POA: Diagnosis not present

## 2020-01-09 NOTE — Progress Notes (Signed)
Virtual Visit via Video Note I connected with Brandi Cherry on 01/09/20 by a video enabled telemedicine application and verified that I am speaking with the correct person using two identifiers.  Location patient: home Location provider:work office Persons participating in the virtual visit: patient, provider  I discussed the limitations of evaluation and management by telemedicine and the availability of in person appointments. The patient expressed understanding and agreed to proceed. Chief Complaint  Patient presents with   Cough   chest congestion   HPI: Brandi Guidotti is a 54 yo female with history of GERD, OSA, and back pain concerned about persistent cough. Reported evaluation in acute care on 12/24/2019, evaluated because her throat, nasal congestion, and sore throat. According to patient, she was diagnosed with bronchitis, treated with Z-Pak and methylprednisolone. Completed oral steroid last week.  She was feeling better until 1 to 2 days ago when she started with "funny" throat feeling and worsening cough. In the morning cough productive, yellowish sputum, negative for hemoptysis.  In the afternoon cough is nonproductive.  She was given Hycodan to help with cough but she has not taken it because cough "is not bad."  + Nasal congestion,rhinorrhea,post nasal drainage,ear fulness and pressure sensation. Ears "pop" when she blows her nose. Intermittent episodes of frontal headache, exacerbated by nasal congestion and alleviated by blowing her nose. Negative for anosmia,ageusia,dyspnea or wheezing. Denies fever, chills, dysphagia, CP, abdominal pain, N/V, or heartburn. Negative for changes in hearing.  No recent travel. Son was sick a few days ago.  She had COVID 19 vaccine x 2.  ROS: See pertinent positives and negatives per HPI.  Past Medical History:  Diagnosis Date   Adhesive capsulitis of shoulder 2014   s/p GH steroid injection   AVNRT (AV nodal re-entry tachycardia)  ([place]) 04/2013   s/p ablation (Dr[facility] Teena [facility]unk at [facility])   Benign fasciculations    over 2 years   Cancer [facility])    skin cancer, 3 basal cell/on forehead, top head, top arm   Hemorrhoids    has had since childbirth   History of miscarriage    G5P2   History of UTI    tends to get with relations   HSV-2 (herpes simplex virus 2) infection    on L lateral thigh only, dx by GYN with swab   Hx of migraines    with cycle   Melanoma in situ of face [facility])    gets yearly skin checks [name])   Osteoarthritis 2015   IPs of hands   Post-operative nausea and vomiting    Urine incontinence    Vertigo     Past Surgical History:  Procedure Laterality Date   BASAL CELL CARCINOMA EXCISION     forehead, left arm   CARDIAC ELECTROPHYSIOLOGY STUDY AND ABLATION  04/2013   AVNRT ([facility])   Gustavus  05/2013   uterine polyp with menorrhagia   hospitalization  2002   urosepsis, again with son    Family History  Problem Relation Age of Onset   Thyroid disease Mother 38   Neuropathy Mother    Coronary artery disease Father 36       MI   Hypertension Father    Cancer Maternal Uncle        unsure   Mental retardation Maternal Grandmother        bipolar   Arthritis Other        strong on mother's side, no  RA though   Asthma Son    Colon cancer Maternal Grandfather    Diabetes Neg Hx    Rectal cancer Neg Hx     Social History   Socioeconomic History   Marital status: Married    Spouse name: Not on file   Number of children: 2   Years of education: masters   Highest education level: Not on file  Occupational History   Occupation: Nurse, adult: [employer]  Tobacco Use   Smoking status: Never Smoker   Smokeless tobacco: Never Used  Scientific laboratory technician Use: Never used  Substance and Sexual Activity   Alcohol use: No    Alcohol/week: 0.0 standard drinks   Drug use: No   Sexual  activity: Not on file  Other Topics Concern   Not on file  Social History Narrative   Caffeine: occasional tea   Lives with husband, son and daughter   Occupation; Animal nutritionist at [employer]   Edu: Masters   Activity: none currently   Diet: good water, daily fruits/vegetables, red meat 1x/wk, fish 2x/wk   Social Determinants of Radio broadcast assistant Strain:    Difficulty of Paying Living Expenses:   Food Insecurity:    Worried About Charity fundraiser in the Last Year:    Arboriculturist in the Last Year:   Transportation Needs:    Film/video editor (Medical):    Lack of Transportation (Non-Medical):   Physical Activity:    Days of Exercise per Week:    Minutes of Exercise per Session:   Stress:    Feeling of Stress :   Social Connections:    Frequency of Communication with Friends and Family:    Frequency of Social Gatherings with Friends and Family:    Attends Religious Services:    Active Member of Clubs or Organizations:    Attends Music therapist:    Marital Status:   Intimate Partner Violence:    Fear of Current or Ex-Partner:    Emotionally Abused:    Physically Abused:    Sexually Abused:     Current Outpatient Medications:    azithromycin (ZITHROMAX) 250 MG tablet, Take by mouth., Disp: , Rfl:    CALCIUM PO, Take by mouth. Calcium and vit d chewable-Chew 2 daily, Disp: , Rfl:    COMBIPATCH 0.05-0.14 MG/DAY, APPLY 1 PATCH TWICE A WEEK AS DIRECTED FOR 30 DAYS, Disp: , Rfl:    HYDROcodone-homatropine (HYCODAN) 5-1.5 MG/5ML syrup, Take 5 mLs by mouth every 4 (four) hours as needed., Disp: , Rfl:    methylPREDNISolone (MEDROL DOSEPAK) 4 MG TBPK tablet, Take by mouth as directed., Disp: , Rfl:    Multiple Vitamin (MULTIVITAMIN) tablet, Take 1 tablet by mouth daily., Disp: , Rfl:    valACYclovir (VALTREX) 500 MG tablet, Take 500 mg by mouth daily as needed. , Disp: , Rfl:   EXAM: VITALS per  patient if applicable:Ht 5\' 6"  (1.676 m)    LMP 06/01/2016 Comment: started bleeding on Friday 06/12/17   BMI 26.79 kg/m   GENERAL: alert, oriented, appears well and in no acute distress  HEENT: atraumatic, conjunttiva clear, no obvious abnormalities on inspection of external nose and ears. Nasal voice and rhinorrhea.  LUNGS: on inspection no signs of respiratory distress, breathing rate appears normal, no obvious gross SOB, gasping or wheezing  CV: no obvious cyanosis  Brandi: moves all visible extremities without noticeable abnormality  PSYCH/NEURO: pleasant and cooperative, no obvious depression or anxiety, speech and thought processing grossly intact  ASSESSMENT AND PLAN:  Discussed the following assessment and plan:  Dysfunction of both eustachian tubes We discussed diagnosis, prognosis, and treatment options. Auto inflation maneuvers-times during the day and plain Mucinex may help.  Cough - Plan: DG Chest 2 View Explained that cough can last a few weeks after URI. History does not suggest a serious process + she already completed antibiotic treatment. CXR ordered. Instructed to monitor for fever or new associated symptoms. Further recommendation will be given according to imaging results. Instructed about warning signs.  I discussed the assessment and treatment plan with the patient. Brandi Guerry was provided an opportunity to ask questions and all were answered. She agreed with the plan and demonstrated an understanding of the instructions.   No follow-ups on file.  Stacy Sailer Martinique, MD

## 2020-01-10 ENCOUNTER — Ambulatory Visit (INDEPENDENT_AMBULATORY_CARE_PROVIDER_SITE_OTHER)
Admission: RE | Admit: 2020-01-10 | Discharge: 2020-01-10 | Disposition: A | Discharge status: Home / Self Care | Payer: BC Managed Care – PPO | Source: Ambulatory Visit | Attending: Family Medicine | Admitting: Family Medicine

## 2020-01-10 ENCOUNTER — Other Ambulatory Visit: Payer: Self-pay

## 2020-01-10 DIAGNOSIS — R059 Cough, unspecified: Secondary | ICD-10-CM

## 2020-01-10 DIAGNOSIS — R05 Cough: Secondary | ICD-10-CM | POA: Diagnosis not present

## 2020-01-18 NOTE — Progress Notes (Deleted)
Office Visit Note  Patient: Brandi Cherry             Date of Birth: August 11, 1965           MRN: [id number]             PCP: Martinique, Betty G, MD Referring: Martinique, Betty G, MD Visit Date: 01/31/2020 Occupation: @GUAROCC @  Subjective:  No chief complaint on file.   History of Present Illness: Brandi Cherry is a 54 y.o. female ***   Activities of Daily Living:  Patient reports morning stiffness for *** {minute/hour:19697}.   Patient {ACTIONS;DENIES/REPORTS:21021675::"Denies"} nocturnal pain.  Difficulty dressing/grooming: {ACTIONS;DENIES/REPORTS:21021675::"Denies"} Difficulty climbing stairs: {ACTIONS;DENIES/REPORTS:21021675::"Denies"} Difficulty getting out of chair: {ACTIONS;DENIES/REPORTS:21021675::"Denies"} Difficulty using hands for taps, buttons, cutlery, and/or writing: {ACTIONS;DENIES/REPORTS:21021675::"Denies"}  No Rheumatology ROS completed.   PMFS History:  Patient Active Problem List   Diagnosis Date Noted  . Muscle fasciculation 03/30/2018  . OA (osteoarthritis) of finger, left 10/12/2017  . Ptosis, left eyelid 12/25/2016  . Acquired deformity of toenail 12/25/2016  . Tremor 10/11/2016  . Paresthesia 10/11/2016  . Gastroesophageal reflux disease 08/02/2016  . 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 Vasovagal episode 07/31/2016  . Benign paroxysmal positional vertigo of left ear 07/07/2016  . Enlarged tonsils 01/09/2015  . Dry mouth 12/29/2014  . Heel pain, bilateral 02/23/2014  . 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 Paroxysmal atrial tachycardia ([place]) 07/21/2012  . Lower back pain 11/11/2011  . Hx of migraines   . Vertigo   . HSV-2 (herpes simplex virus 2) infection   . Melanoma in situ of face ([place])   . History of UTI     Past Medical History:  Diagnosis Date  . Adhesive capsulitis of shoulder 2014   s/p GH steroid injection  . AVNRT (AV nodal re-entry tachycardia) ([place])  04/2013   s/p ablation ([facility] at [facility])  . Benign fasciculations    over 2 years  . 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 Hemorrhoids    has had since childbirth  . History of miscarriage    G5P2  . History of UTI    tends to get with relations  . 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 Hx of migraines    with cycle  . Melanoma in situ of face [facility])    gets yearly skin checks [name])  . 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 Post-operative nausea and vomiting   . Urine incontinence   . Vertigo     Family History  Problem Relation Age of Onset  . Thyroid disease Mother 45  . Neuropathy Mother   . Coronary artery disease Father 33       MI  . Hypertension Father   . Cancer Maternal Uncle        unsure  . Mental retardation Maternal Grandmother        bipolar  . Arthritis Other        strong on mother's side, no RA though  . Asthma Son   . Colon cancer Maternal Grandfather   . Diabetes Neg Hx   . Rectal cancer Neg Hx    Past Surgical History:  Procedure Laterality Date  . BASAL CELL CARCINOMA EXCISION     forehead, left arm  .  CARDIAC ELECTROPHYSIOLOGY STUDY AND ABLATION  04/2013   AVNRT (EP Dr. [facility]ena [facility]unk at [facility])  . DILATION AND CURETTAGE OF UTERUS  05/2013   uterine polyp with menorrhagia  . hospitalization  2002   urosepsis, again with son   Social History   Social History Narrative   Caffeine: occasional tea   Lives with husband, son and daughter   Occupation; Animal nutritionist at [employer]   Edu: Masters   Activity: none currently   Diet: good water, daily fruits/vegetables, red meat 1x/wk, fish 2x/wk   Immunization History  Administered Date(s) Administered  . Tdap 07/16/2011     Objective: Vital Signs: LMP 06/01/2016    Physical Exam   Musculoskeletal Exam: ***  CDAI Exam: CDAI Score: -- Patient Global: --; Provider Global:  -- Swollen: --; Tender: -- Joint Exam 01/31/2020   No joint exam has been documented for this visit   There is currently no information documented on the homunculus. Go to the Rheumatology activity and complete the homunculus joint exam.  Investigation: No additional findings.  Imaging: DG Chest 2 View  Result Date: 01/10/2020 CLINICAL DATA:  Persistent cough EXAM: CHEST - 2 VIEW COMPARISON:  06/29/2016 FINDINGS: The heart size and mediastinal contours are within normal limits. Both lungs are clear. The visualized skeletal structures are unremarkable. IMPRESSION: No active cardiopulmonary disease. Electronically Signed   By: Davina Poke D.O.   On: 01/10/2020 16:09    Recent Labs: Lab Results  Component Value Date   WBC 3.7 (L) 09/21/2019   HGB 14.4 09/21/2019   PLT 230.0 09/21/2019   NA 137 09/21/2019   K 3.9 09/21/2019   CL 104 09/21/2019   CO2 28 09/21/2019   GLUCOSE 85 09/21/2019   BUN 7 09/21/2019   CREATININE 0.64 09/21/2019   BILITOT 0.5 09/21/2019   ALKPHOS 64 09/21/2019   AST 17 09/21/2019   ALT 10 09/21/2019   PROT 7.5 09/21/2019   ALBUMIN 4.5 09/21/2019   CALCIUM 9.1 09/21/2019   GFRAA >60 07/24/2017    Speciality Comments: No specialty comments available.  Procedures:  No procedures performed Allergies: Pamelor [nortriptyline hcl]   Assessment / Plan:     Visit Diagnoses: Positive ANA (antinuclear antibody) - 06/27/19: ANA 1:40 cytoplasmic, CRP <1: We discussed the insignificance of the low titer ANA result.  All other antibodies are negative.  Sicca syndrome (HCC)  Dupuytren's contracture of right hand  Primary osteoarthritis of both hands  Adhesive capsulitis of both shoulders  Fibromyalgia  Other fatigue  Primary insomnia  Vitamin D deficiency  Orders: No orders of the defined types were placed in this encounter.  No orders of the defined types were placed in this encounter.   Face-to-face time spent with patient was ***  minutes. Greater than 50% of time was spent in counseling and coordination of care.  Follow-Up Instructions: No follow-ups on file.   Ofilia Neas, PA-C  Note - This record has been created using Dragon software.  Chart creation errors have been sought, but may not always  have been located. Such creation errors do not reflect on  the standard of medical care.

## 2020-01-31 ENCOUNTER — Ambulatory Visit: Payer: BC Managed Care – PPO | Admitting: Rheumatology

## 2020-11-05 ENCOUNTER — Other Ambulatory Visit: Payer: Self-pay

## 2020-11-05 ENCOUNTER — Ambulatory Visit: Payer: BC Managed Care – PPO | Admitting: Family Medicine

## 2020-11-05 VITALS — BP 114/74 | HR 79 | Temp 97.9°F | Resp 16 | Wt 167.1 lb

## 2020-11-05 DIAGNOSIS — J029 Acute pharyngitis, unspecified: Secondary | ICD-10-CM | POA: Diagnosis not present

## 2020-11-05 LAB — POCT RAPID STREP A (OFFICE): Rapid Strep A Screen: NEGATIVE

## 2020-11-05 NOTE — Progress Notes (Signed)
Established Patient Office Visit  Subjective:  Patient ID: Brandi Cherry, female    DOB: 10/28/1965  Age: 55 y.o. MRN: [id number]  CC:  Chief Complaint  Patient presents with  . Sore Throat     Brandi Cherry presents for 10-day history of sore throat symptoms.  She has had some postnasal drip symptoms.  Generally does not suffer from spring allergies.  No headaches.  No facial pain.  No fevers or chills.  No significant cough.  No recent noted adenopathy.  She states Brandi Cherry who is 55 years old was seen recently by the pediatrician and was felt to have combination of possible viral URI and allergies.  Brandi Cherry is developing some symptoms as well.  She had 2 COVID tests which were negative last week.  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  Past Medical History:  Diagnosis Date  . Adhesive capsulitis of shoulder 2014   s/p GH steroid injection  . AVNRT (AV nodal re-entry tachycardia) ([place]) 04/2013   s/p ablation (Dr. [facility]ena [facility]unk at [facility])  . Benign fasciculations    over 2 years  . 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 Hemorrhoids    has had since childbirth  . History of miscarriage    G5P2  . History of UTI    tends to get with relations  . 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 Hx of migraines    with cycle  . Melanoma in situ of face [facility])    gets yearly skin checks [name])  . 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 Post-operative nausea and vomiting   . Urine incontinence   . Vertigo     Past Surgical History:  Procedure Laterality Date  . BASAL CELL CARCINOMA EXCISION     forehead, left arm  . CARDIAC ELECTROPHYSIOLOGY STUDY AND ABLATION  04/2013   AVNRT (EP Dr. [facility]ena [facility]unk at [facility])  . DILATION AND CURETTAGE OF UTERUS  05/2013   uterine polyp with menorrhagia  . hospitalization  2002   urosepsis, again with Cherry    Family History  Problem Relation Age of  Onset  . Thyroid disease Mother 71  . Neuropathy Mother   . Coronary artery disease Father 68       MI  . Hypertension Father   . Cancer Maternal Uncle        unsure  . Mental retardation Maternal Grandmother        bipolar  . Arthritis Other        strong on mother's side, no RA though  . Asthma Cherry   . Colon cancer Maternal Grandfather   . Diabetes Neg Hx   . Rectal cancer Neg Hx     Social History   Socioeconomic History  . Marital status: Married    Spouse name: Not on file  . Number of children: 2  . Years of education: masters  . Highest education level: Not on file  Occupational History  . Occupation: Nurse, adult: [employer]  Tobacco Use  . Smoking status: Never Smoker  . Smokeless tobacco: Never Used  Vaping Use  . Vaping Use: Never used  Substance and Sexual Activity  . Alcohol use: No    Alcohol/week: 0.0 standard drinks  . Drug use: No  . Sexual  activity: Not on file  Other Topics Concern  . Not on file  Social History Narrative   Caffeine: occasional tea   Lives with Cherry, Cherry and daughter   Occupation; Animal nutritionist at [employer]   Edu: Masters   Activity: none currently   Diet: good water, daily fruits/vegetables, red meat 1x/wk, fish 2x/wk   Social Determinants of Health   Financial Resource Strain: Not on file  Food Insecurity: Not on file  Transportation Needs: Not on file  Physical Activity: Not on file  Stress: Not on file  Social Connections: Not on file  Intimate Partner Violence: Not on file    Outpatient Medications Prior to Visit  Medication Sig Dispense Refill  . CALCIUM PO Take by mouth. Calcium and vit d chewable-Chew 2 daily    . COMBIPATCH 0.05-0.14 MG/DAY APPLY 1 PATCH TWICE A WEEK AS DIRECTED FOR 30 DAYS    . HYDROcodone-homatropine (HYCODAN) 5-1.5 MG/5ML syrup Take 5 mLs by mouth every 4 (four) hours as needed.    . Multiple Vitamin (MULTIVITAMIN) tablet Take 1 tablet by mouth  daily.    . valACYclovir (VALTREX) 500 MG tablet Take 500 mg by mouth daily as needed.      No facility-administered medications prior to visit.    Allergies  Allergen Reactions  . Pamelor [Nortriptyline Hcl] Other (See Comments)    fasciculations    ROS Review of Systems  Constitutional: Negative for chills and fever.  HENT: Positive for postnasal drip and sore throat. Negative for sinus pressure and sinus pain.   Respiratory: Negative for cough and shortness of breath.       Objective:    Physical Exam Vitals reviewed.  Constitutional:      Appearance: She is well-developed.  HENT:     Right Ear: Tympanic membrane normal.     Left Ear: Tympanic membrane normal.     Nose:     Comments: Oropharynx reveals very minimal erythema.  No exudate.  No tonsillar asymmetry. Cardiovascular:     Rate and Rhythm: Normal rate and regular rhythm.  Pulmonary:     Effort: Pulmonary effort is normal.     Breath sounds: Normal breath sounds.  Musculoskeletal:     Cervical back: Neck supple.  Lymphadenopathy:     Cervical: No cervical adenopathy.  Neurological:     Mental Status: She is alert.     BP 114/74   Pulse 79   Temp 97.9 F (36.6 C) (Oral)   Resp 16   Wt 167 lb 1.6 oz (75.8 kg)   LMP 06/01/2016   SpO2 98%   BMI 26.97 kg/m  Wt Readings from Last 3 Encounters:  11/05/20 167 lb 1.6 oz (75.8 kg)  09/21/19 166 lb (75.3 kg)  08/08/19 171 lb (77.6 kg)     Health Maintenance Due  Topic Date Due  . Hepatitis C Screening  Never done  . MAMMOGRAM  03/03/2020  . PAP SMEAR-Modifier  10/14/2020    There are no preventive care reminders to display for this patient.  Lab Results  Component Value Date   TSH 1.37 04/15/2019   Lab Results  Component Value Date   WBC 3.7 (L) 09/21/2019   HGB 14.4 09/21/2019   HCT 42.7 09/21/2019   MCV 86.7 09/21/2019   PLT 230.0 09/21/2019   Lab Results  Component Value Date   NA 137 09/21/2019   K 3.9 09/21/2019   CO2 28  09/21/2019   GLUCOSE 85 09/21/2019  BUN 7 09/21/2019   CREATININE 0.64 09/21/2019   BILITOT 0.5 09/21/2019   ALKPHOS 64 09/21/2019   AST 17 09/21/2019   ALT 10 09/21/2019   PROT 7.5 09/21/2019   ALBUMIN 4.5 09/21/2019   CALCIUM 9.1 09/21/2019   ANIONGAP 9 07/24/2017   GFR 97.17 09/21/2019   Lab Results  Component Value Date   CHOL 157 04/15/2019   Lab Results  Component Value Date   HDL 37.40 (L) 04/15/2019   Lab Results  Component Value Date   LDLCALC 100 (H) 04/15/2019   Lab Results  Component Value Date   TRIG 100.0 04/15/2019   Lab Results  Component Value Date   CHOLHDL 4 04/15/2019   Lab Results  Component Value Date   HGBA1C 5.6 04/15/2019      Assessment & Plan:   Problem List Items Addressed This Visit   None   Visit Diagnoses    Sore throat    -  Primary   Relevant Orders   POC Rapid Strep A    Suspect probably related to postnasal drip symptoms.  No evidence for acute sinusitis.  Given duration of symptoms will check rapid strep.  If negative, treat symptomatically and consider trial of over-the-counter Flonase  -Rapid strep negative -Add Flonase as above and touch base if symptoms persist  No orders of the defined types were placed in this encounter.   Follow-up: No follow-ups on file.    Carolann Littler, MD

## 2020-11-05 NOTE — Patient Instructions (Signed)
Rapid strep is negative  Consider trial of over-the-counter Flonase 2 sprays per nostril once daily for postnasal drip

## 2020-12-21 ENCOUNTER — Encounter: Payer: Self-pay | Admitting: Internal Medicine

## 2020-12-21 ENCOUNTER — Other Ambulatory Visit: Payer: Self-pay

## 2020-12-21 ENCOUNTER — Ambulatory Visit: Payer: BC Managed Care – PPO | Admitting: Internal Medicine

## 2020-12-21 VITALS — BP 110/80 | HR 87 | Temp 98.2°F | Wt 170.1 lb

## 2020-12-21 DIAGNOSIS — H01119 Allergic dermatitis of unspecified eye, unspecified eyelid: Secondary | ICD-10-CM | POA: Diagnosis not present

## 2020-12-21 MED ORDER — TRIAMCINOLONE ACETONIDE 0.1 % EX CREA: 1.0000 "application " | TOPICAL_CREAM | Freq: Two times a day (BID) | CUTANEOUS | 0 refills | Status: DC

## 2020-12-21 NOTE — Progress Notes (Signed)
Acute office Visit     This visit occurred during the SARS-CoV-2 public health emergency.  Safety protocols were in place, including screening questions prior to the visit, additional usage of staff PPE, and extensive cleaning of exam room while observing appropriate contact time as indicated for disinfecting solutions.    CC/Reason for Visit: Eyelid rash  HPI: Brandi Cherry is a 55 y.o. female who is coming in today for the above mentioned reasons.  About 10 days ago she started noticing some burning eyelids but mainly the left one.  She has not been using any new soaps, lotions, sunscreens.  She now has a red, scaly rash over her eyelids in the corner of her eyes.  Past Medical/Surgical History: Past Medical History:  Diagnosis Date   Adhesive capsulitis of shoulder 2014   s/p GH steroid injection   AVNRT (AV nodal re-entry tachycardia) ([place]) 04/2013   s/p ablation (Dr. 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, top head, top arm   Hemorrhoids    has had since childbirth   History of miscarriage    G5P2   History of UTI    tends to get with relations   HSV-2 (herpes simplex virus 2) infection    on L lateral thigh only, dx by GYN with swab   Hx of migraines    with cycle   Melanoma in situ of face [facility])    gets yearly skin checks [name])   Osteoarthritis 2015   IPs of hands   Post-operative nausea and vomiting    Urine incontinence    Vertigo     Past Surgical History:  Procedure Laterality Date   BASAL CELL CARCINOMA EXCISION     forehead, left arm   CARDIAC ELECTROPHYSIOLOGY STUDY AND ABLATION  04/2013   AVNRT ([facility] Dr[facility] at [facility])   [place]  05/2013   uterine polyp with menorrhagia   hospitalization  2002   urosepsis, again with son    Social History:  reports that she has never smoked. She has never used smokeless tobacco. She reports that  she does not drink alcohol and does not use drugs.  Allergies: Allergies  Allergen Reactions   Pamelor [Nortriptyline Hcl] Other (See Comments)    fasciculations    Family History:  Family History  Problem Relation Age of Onset   Thyroid disease Mother 70   Neuropathy Mother    Coronary artery disease Father 33       MI   Hypertension Father    Cancer Maternal Uncle        unsure   Mental retardation Maternal Grandmother        bipolar   Arthritis Other        strong on mother's side, no RA though   Asthma Son    Colon cancer Maternal Grandfather    Diabetes Neg Hx    Rectal cancer Neg Hx      Current Outpatient Medications:    CALCIUM PO, Take by mouth. Calcium and vit d chewable-Chew 2 daily, Disp: , Rfl:    COMBIPATCH 0.05-0.14 MG/DAY, APPLY 1 PATCH TWICE A WEEK AS DIRECTED FOR 30 DAYS, Disp: , Rfl:    Multiple Vitamin (MULTIVITAMIN) tablet, Take 1 tablet by mouth daily., Disp: , Rfl:    triamcinolone cream (KENALOG) 0.1 %, Apply 1 application  topically 2 (two) times daily., Disp: 30 g, Rfl: 0   valACYclovir (VALTREX) 500 MG tablet, Take 500 mg by mouth daily as needed. , Disp: , Rfl:   Review of Systems:  Constitutional: Denies fever, chills, diaphoresis, appetite change and fatigue.  HEENT: Denies photophobia, redness, hearing loss, ear pain, congestion, sore throat, rhinorrhea, sneezing, mouth sores, trouble swallowing, neck pain, neck stiffness and tinnitus.   Respiratory: Denies SOB, DOE, cough, chest tightness,  and wheezing.   Cardiovascular: Denies chest pain, palpitations and leg swelling.  Gastrointestinal: Denies nausea, vomiting, abdominal pain, diarrhea, constipation, blood in stool and abdominal distention.  Genitourinary: Denies dysuria, urgency, frequency, hematuria, flank pain and difficulty urinating.  Endocrine: Denies: hot or cold intolerance, sweats, changes in hair or nails, polyuria, polydipsia. Musculoskeletal: Denies myalgias, back pain, joint  swelling, arthralgias and gait problem.  Skin: Denies pallor, rash and wound.  Neurological: Denies dizziness, seizures, syncope, weakness, light-headedness, numbness and headaches.  Hematological: Denies adenopathy. Easy bruising, personal or family bleeding history  Psychiatric/Behavioral: Denies suicidal ideation, mood changes, confusion, nervousness, sleep disturbance and agitation    Physical Exam: Vitals:   12/21/20 1530  BP: 110/80  Pulse: 87  Temp: 98.2 F (36.8 C)  TempSrc: Oral  SpO2: 99%  Weight: 170 lb 1.6 oz (77.2 kg)    Body mass index is 27.45 kg/m.   Constitutional: NAD, calm, comfortable Eyes: PERRL, conjunctivae normal, patchy erythema with scaly skin over bilateral upper eyelids and corners of the eyes ENMT: Mucous membranes are moist. Neurologic: Grossly intact and nonfocal Psychiatric: Normal judgment and insight. Alert and oriented x 3. Normal mood.    Impression and Plan:  Eyelid dermatitis, allergic/contact  - Plan: triamcinolone cream (KENALOG) 0.1 % to apply twice daily for 5 days.     Lelon Frohlich, MD [facility]

## 2021-01-04 ENCOUNTER — Encounter: Payer: Self-pay | Admitting: Internal Medicine

## 2021-07-16 ENCOUNTER — Telehealth: Payer: BC Managed Care – PPO | Admitting: Physician Assistant

## 2021-07-16 DIAGNOSIS — U071 COVID-19: Secondary | ICD-10-CM | POA: Diagnosis not present

## 2021-07-16 MED ORDER — BENZONATATE 100 MG PO CAPS: 100.0000 mg | ORAL_CAPSULE | Freq: Three times a day (TID) | ORAL | 0 refills | Status: DC | PRN

## 2021-07-16 NOTE — Patient Instructions (Signed)
Brandi Cherry, thank you for joining Mar Daring, PA-C for today's virtual visit.  While this provider is not your primary care provider (PCP), if your PCP is located in our provider database this encounter information will be shared with them immediately following your visit.  Consent: (Patient) Brandi Cherry provided verbal consent for this virtual visit at the beginning of the encounter.  Current Medications:  Current Outpatient Medications:    benzonatate (TESSALON) 100 MG capsule, Take 1 capsule (100 mg total) by mouth 3 (three) times daily as needed., Disp: 30 capsule, Rfl: 0   CALCIUM PO, Take by mouth. Calcium and vit d chewable-Chew 2 daily, Disp: , Rfl:    COMBIPATCH 0.05-0.14 MG/DAY, APPLY 1 PATCH TWICE A WEEK AS DIRECTED FOR 30 DAYS, Disp: , Rfl:    Multiple Vitamin (MULTIVITAMIN) tablet, Take 1 tablet by mouth daily., Disp: , Rfl:    triamcinolone cream (KENALOG) 0.1 %, Apply 1 application topically 2 (two) times daily., Disp: 30 g, Rfl: 0   valACYclovir (VALTREX) 500 MG tablet, Take 500 mg by mouth daily as needed. , Disp: , Rfl:    Medications ordered in this encounter:  Meds ordered this encounter  Medications   benzonatate (TESSALON) 100 MG capsule    Sig: Take 1 capsule (100 mg total) by mouth 3 (three) times daily as needed.    Dispense:  30 capsule    Refill:  0    Order Specific Question:   Supervising Provider    Answer:   Sabra Heck, [place]     *If you need refills on other medications prior to your next appointment, please contact your pharmacy*  Follow-Up: Call back or seek an in-person evaluation if the symptoms worsen or if the condition fails to improve as anticipated.  Other Instructions 10 Things You Can Do to Manage Your COVID-19 Symptoms at Home If you have possible or confirmed COVID-19 Stay home except to get medical care. Monitor your symptoms carefully. If your symptoms get worse, call your healthcare provider immediately. Get  rest and stay hydrated. If you have a medical appointment, call the healthcare provider ahead of time and tell them that you have or may have COVID-19. For medical emergencies, call 911 and notify the dispatch personnel that you have or may have COVID-19. Cover your cough and sneezes with a tissue or use the inside of your elbow. Wash your hands often with soap and water for at least 20 seconds or clean your hands with an alcohol-based hand sanitizer that contains at least 60% alcohol. As much as possible, stay in a specific room and away from other people in your home. Also, you should use a separate bathroom, if available. If you need to be around other people in or outside of the home, wear a mask. Avoid sharing personal items with other people in your household, like dishes, towels, and bedding. Clean all surfaces that are touched often, like counters, tabletops, and doorknobs. Use household cleaning sprays or wipes according to the label instructions. [email or web address] 01/13/2020 This information is not intended to replace advice given to you by your health care provider. Make sure you discuss any questions you have with your health care provider. Document Revised: 03/08/2021 Document Reviewed: 03/08/2021 Elsevier Patient Education  2022 [employer].    If you have been instructed to have an in-person evaluation today at a local Urgent Care facility, please use the link below. It will take you to a list of all of our  available [facility], including address, phone number and hours of operation. Please do not delay care.  [facility]  If you or a family member do not have a primary care provider, use the link below to schedule a visit and establish care. When you choose a [facility] primary care physician or advanced practice provider, you gain a long-term partner in health. Find a Primary Care Provider  Learn more about 's in-office and virtual  care options: [facility]

## 2021-07-16 NOTE — Progress Notes (Signed)
Virtual Visit Consent   Brandi Cherry, you are scheduled for a virtual visit with a [facility] provider today.     Just as with appointments in the office, your consent must be obtained to participate.  Your consent will be active for this visit and any virtual visit you may have with one of our providers in the next 365 days.     If you have a MyChart account, a copy of this consent can be sent to you electronically.  All virtual visits are billed to your insurance company just like a traditional visit in the office.    As this is a virtual visit, video technology does not allow for your provider to perform a traditional examination.  This may limit your provider's ability to fully assess your condition.  If your provider identifies any concerns that need to be evaluated in person or the need to arrange testing (such as labs, EKG, etc.), we will make arrangements to do so.     Although advances in technology are sophisticated, we cannot ensure that it will always work on either your end or our end.  If the connection with a video visit is poor, the visit may have to be switched to a telephone visit.  With either a video or telephone visit, we are not always able to ensure that we have a secure connection.     I need to obtain your verbal consent now.   Are you willing to proceed with your visit today?    Brandi Cherry has provided verbal consent on 07/16/2021 for a virtual visit (video or telephone).   Mar Daring, PA-C   Date: 07/16/2021 8:48 AM   Virtual Visit via Video Note   I, Mar Daring, connected with  Brandi Cherry  ([id number], 15-Sep-1965) on 07/16/21 at  8:30 AM EST by a video-enabled telemedicine application and verified that I am speaking with the correct person using two identifiers.  Location: Patient: Virtual Visit Location Patient: Home Provider: Virtual Visit Location Provider: Home Office   I discussed the limitations of evaluation and  management by telemedicine and the availability of in person appointments. The patient expressed understanding and agreed to proceed.    History of Present Illness: Brandi Cherry is a 56 y.o. who identifies as a female who was assigned female at birth, and is being seen today for Covid 50.  HPI: URI  This is a new problem. Episode onset: Tested positive for covid this morning; Symptoms started on 07/14/21. The problem has been gradually worsening. The maximum temperature recorded prior to her arrival was 100.4 - 100.9 F. The fever has been present for 1 to 2 days. Associated symptoms include coughing (worse last night; dry), ear pain, headaches, nausea, sinus pain and a sore throat (scratchy throat; improved). Pertinent negatives include no congestion, diarrhea, plugged ear sensation, rhinorrhea, vomiting or wheezing. Associated symptoms comments: Chills, body aches. She has tried sleep and increased fluids (mucinex) for the symptoms. The treatment provided no relief.     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 Calcium and vit d chewable-Chew 2 daily, Disp: , Rfl:    COMBIPATCH 0.05-0.14 MG/DAY, APPLY 1 PATCH TWICE A WEEK AS DIRECTED FOR 30 DAYS, Disp: , Rfl:    Multiple Vitamin (MULTIVITAMIN) tablet, Take 1 tablet by mouth daily., Disp: , Rfl:    triamcinolone cream (KENALOG) 0.1 %, Apply 1 application topically 2 (two) times daily., Disp: 30 g, Rfl: 0   valACYclovir (VALTREX) 500 MG tablet, Take 500 mg by mouth daily as needed. , Disp: , Rfl:   Observations/Objective: Patient is well-developed, well-nourished in no acute distress.  Resting comfortably at home.  Head is normocephalic, atraumatic.  No labored breathing.  Speech is clear and coherent with logical content.  Patient is alert and oriented at baseline.    Assessment and Plan: 1. COVID-19 - MyChart COVID-19 home monitoring program; Future - benzonatate (TESSALON) 100 MG capsule; Take 1 capsule (100 mg total) by mouth 3 (three) times daily as needed.  Dispense: 30 capsule; Refill: 0  - Continue OTC symptomatic management of choice - Will send OTC vitamins and supplement information through AVS - Tessalon perles prescribed for cough - Patient enrolled in MyChart symptom monitoring - Push fluids - Rest as needed - Discussed return precautions and when to seek in-person evaluation, sent via AVS as well  Follow Up Instructions: I discussed the assessment and treatment plan with the patient. The patient was provided an opportunity to ask questions and all were answered. The patient agreed with the plan and demonstrated an understanding of the instructions.  A copy of instructions were sent to the patient via MyChart unless otherwise noted below.   The patient was advised to call back or  seek an in-person evaluation if the symptoms worsen or if the condition fails to improve as anticipated.  Time:  I spent 12 minutes with the patient via telehealth technology discussing the above problems/concerns.    Mar Daring, PA-C

## 2021-11-11 NOTE — Progress Notes (Deleted)
Office Visit Note  Patient: Brandi Cherry             Date of Birth: 1966-03-30           MRN: [id number]             PCP: Martinique, Betty G, MD Referring: Martinique, Betty G, MD Visit Date: 11/13/2021 Occupation: '@GUAROCC'$ @  Subjective:  No chief complaint on file.   History of Present Illness: Brandi Cherry is a 56 y.o. female ***   Activities of Daily Living:  Patient reports morning stiffness for *** {minute/hour:19697}.   Patient {ACTIONS;DENIES/REPORTS:21021675::"Denies"} nocturnal pain.  Difficulty dressing/grooming: {ACTIONS;DENIES/REPORTS:21021675::"Denies"} Difficulty climbing stairs: {ACTIONS;DENIES/REPORTS:21021675::"Denies"} Difficulty getting out of chair: {ACTIONS;DENIES/REPORTS:21021675::"Denies"} Difficulty using hands for taps, buttons, cutlery, and/or writing: {ACTIONS;DENIES/REPORTS:21021675::"Denies"}  No Rheumatology ROS completed.   PMFS History:  Patient Active Problem List   Diagnosis Date Noted  . Muscle fasciculation 03/30/2018  . OA (osteoarthritis) of finger, left 10/12/2017  . Ptosis, left eyelid 12/25/2016  . Acquired deformity of toenail 12/25/2016  . Tremor 10/11/2016  . Paresthesia 10/11/2016  . Gastroesophageal reflux disease 08/02/2016  . 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 Vasovagal episode 07/31/2016  . Benign paroxysmal positional vertigo of left ear 07/07/2016  . Enlarged tonsils 01/09/2015  . Dry mouth 12/29/2014  . Heel pain, bilateral 02/23/2014  . 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 Paroxysmal atrial tachycardia ([place]) 07/21/2012  . Lower back pain 11/11/2011  . Hx of migraines   . Vertigo   . HSV-2 (herpes simplex virus 2) infection   . Melanoma in situ of face ([place])   . History of UTI     Past Medical History:  Diagnosis Date  . Adhesive capsulitis of shoulder 2014   s/p GH steroid injection  . AVNRT (AV nodal re-entry tachycardia) ([place])  04/2013   s/p ablation (Dr. Teena [facility]unk at [facility])  . Benign fasciculations    over 2 years  . 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 Hemorrhoids    has had since childbirth  . History of miscarriage    G5P2  . History of UTI    tends to get with relations  . 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 Hx of migraines    with cycle  . Melanoma in situ of face [facility])    gets yearly skin checks [name])  . 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 Post-operative nausea and vomiting   . Urine incontinence   . Vertigo     Family History  Problem Relation Age of Onset  . Thyroid disease Mother 17  . Neuropathy Mother   . Coronary artery disease Father 66       MI  . Hypertension Father   . Cancer Maternal Uncle        unsure  . Mental retardation Maternal Grandmother        bipolar  . Arthritis Other        strong on mother's side, no RA though  . Asthma Son   . Colon cancer Maternal Grandfather   . Diabetes Neg Hx   . Rectal cancer Neg Hx    Past Surgical History:  Procedure Laterality Date  . BASAL CELL CARCINOMA EXCISION     forehead, left arm  .  CARDIAC ELECTROPHYSIOLOGY STUDY AND ABLATION  04/2013   AVNRT ([facility] Dr[facility] at [facility])  . DILATION AND CURETTAGE OF UTERUS  05/2013   uterine polyp with menorrhagia  . hospitalization  2002   urosepsis, again with son   Social History   Social History Narrative   Caffeine: occasional tea   Lives with husband, son and daughter   Occupation; Animal nutritionist at [employer]   Edu: [employer]   Activity: none currently   Diet: good water, daily fruits/vegetables, red meat 1x/wk, fish 2x/wk   Immunization History  Administered Date(s) Administered  . Tdap 07/16/2011     Objective: Vital Signs: LMP 06/01/2016    Physical Exam   Musculoskeletal Exam: ***  CDAI Exam: CDAI Score: -- Patient Global: --; Provider Global:  -- Swollen: --; Tender: -- Joint Exam 11/13/2021   No joint exam has been documented for this visit   There is currently no information documented on the homunculus. Go to the Rheumatology activity and complete the homunculus joint exam.  Investigation: No additional findings.  Imaging: No results found.  Recent Labs: Lab Results  Component Value Date   WBC 3.7 (L) 09/21/2019   HGB 14.4 09/21/2019   PLT 230.0 09/21/2019   NA 137 09/21/2019   K 3.9 09/21/2019   CL 104 09/21/2019   CO2 28 09/21/2019   GLUCOSE 85 09/21/2019   BUN 7 09/21/2019   CREATININE 0.64 09/21/2019   BILITOT 0.5 09/21/2019   ALKPHOS 64 09/21/2019   AST 17 09/21/2019   ALT 10 09/21/2019   PROT 7.5 09/21/2019   ALBUMIN 4.5 09/21/2019   CALCIUM 9.1 09/21/2019   GFRAA >60 07/24/2017    Speciality Comments: No specialty comments available.  Procedures:  No procedures performed Allergies: Pamelor [nortriptyline hcl]   Assessment / Plan:     Visit Diagnoses: No diagnosis found.  Orders: No orders of the defined types were placed in this encounter.  No orders of the defined types were placed in this encounter.   Face-to-face time spent with patient was *** minutes. Greater than 50% of time was spent in counseling and coordination of care.  Follow-Up Instructions: No follow-ups on file.   Earnestine Mealing, CMA  Note - This record has been created using Editor, commissioning.  Chart creation errors have been sought, but may not always  have been located. Such creation errors do not reflect on  the standard of medical care.

## 2021-11-13 ENCOUNTER — Ambulatory Visit: Payer: BC Managed Care – PPO | Admitting: Rheumatology

## 2021-11-13 DIAGNOSIS — R5383 Other fatigue: Secondary | ICD-10-CM

## 2021-11-13 DIAGNOSIS — M720 Palmar fascial fibromatosis [Dupuytren]: Secondary | ICD-10-CM

## 2021-11-13 DIAGNOSIS — R768 Other specified abnormal immunological findings in serum: Secondary | ICD-10-CM

## 2021-11-13 DIAGNOSIS — M7501 Adhesive capsulitis of right shoulder: Secondary | ICD-10-CM

## 2021-11-13 DIAGNOSIS — M3500 Sicca syndrome, unspecified: Secondary | ICD-10-CM

## 2021-11-13 DIAGNOSIS — E559 Vitamin D deficiency, unspecified: Secondary | ICD-10-CM

## 2021-11-13 DIAGNOSIS — M797 Fibromyalgia: Secondary | ICD-10-CM

## 2021-11-13 DIAGNOSIS — M19041 Primary osteoarthritis, right hand: Secondary | ICD-10-CM

## 2021-11-13 DIAGNOSIS — F5101 Primary insomnia: Secondary | ICD-10-CM

## 2021-11-22 NOTE — Progress Notes (Signed)
Office Visit Note  Patient: Brandi Cherry             Date of Birth: 23-Mar-1966           MRN: [id number]             PCP: Martinique, Betty G, MD Referring: Martinique, Betty G, MD Visit Date: 12/04/2021 Occupation: '@GUAROCC'$ @  Subjective:  Pain of the Right Hand   History of Present Illness: Brandi Cherry is a 56 y.o. female with history of osteoarthritis, days of capsulitis of shoulder joints and sicca symptoms.  She states for the last 1 year she has been having increased pain and discomfort in her right hand.  She has progressive difficulty making a fist with the right hand.  She had problems with her left hand in the past which was diagnosed as osteoarthritis.  She was evaluated by hand surgeon.  The mobility in her left index finger eventually improved.  She states that her shoulder joints are still stiff and have good range of motion now.  She has tenderness over her elbows and her knee joints.  But she does not have any discomfort on a daily basis.  She continues to have dry mouth and dry eyes.  Activities of Daily Living:  Patient reports morning stiffness for 0  none .   Patient Denies nocturnal pain.  Difficulty dressing/grooming: Reports Difficulty climbing stairs: Denies Difficulty getting out of chair: Denies Difficulty using hands for taps, buttons, cutlery, and/or writing: Reports  Review of Systems  Constitutional:  Negative for fatigue.  HENT:  Positive for mouth dryness.   Eyes:  Positive for dryness.  Respiratory:  Negative for shortness of breath.   Cardiovascular:  Negative for swelling in legs/feet.  Gastrointestinal:  Negative for constipation.  Endocrine: Negative for excessive thirst.  Genitourinary:  Negative for difficulty urinating.  Musculoskeletal:  Positive for joint pain, joint pain and joint swelling.  Skin:  Negative for color change, rash and sensitivity to sunlight.  Allergic/Immunologic: Negative for susceptible to infections.  Neurological:   Positive for weakness.  Hematological:  Negative for bruising/bleeding tendency and swollen glands.  Psychiatric/Behavioral:  Positive for sleep disturbance. Negative for depressed mood. The patient is not nervous/anxious.    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) 04/2013   s/p ablation ([facility] at [facility])   Benign fasciculations    over 2 years   Cancer [facility])    skin cancer,  3 basal cell/on forehead, top head, top arm   Hemorrhoids    has had since childbirth   History of miscarriage    G5P2   History of UTI    tends to get with relations   HSV-2 (herpes simplex virus 2) infection    on L lateral thigh only, dx by GYN with swab   Hx of migraines    with cycle   Melanoma in situ of face ([place])    gets yearly skin checks [name])   Osteoarthritis 2015   IPs of hands   Post-operative nausea and vomiting    Urine incontinence    Vertigo     Family History  Problem Relation Age of Onset   Thyroid disease Mother 45    Neuropathy Mother    Coronary artery disease Father 38       MI   Hypertension Father    Cancer Maternal Uncle        unsure   Mental retardation Maternal Grandmother        bipolar   Arthritis Other        strong on mother's side, no RA though   Asthma Son    Colon cancer Maternal Grandfather    Diabetes Neg Hx    Rectal cancer Neg Hx    Past Surgical History:  Procedure Laterality Date   BASAL CELL CARCINOMA EXCISION     forehead, left arm   CARDIAC ELECTROPHYSIOLOGY STUDY AND ABLATION  04/2013   AVNRT (EP Dr. [facility]ena Dunk at [facility])   [place]  05/2013   uterine polyp with menorrhagia   hospitalization  2002   urosepsis, again with son   Social History   Social History Narrative   Caffeine: occasional tea   Lives with husband, son and daughter   Occupation; Animal nutritionist at [employer]   Edu: [employer]   Activity: none currently   Diet: good water, daily fruits/vegetables, red meat 1x/wk, fish 2x/wk   Immunization History  Administered Date(s) Administered   Tdap 07/16/2011     Objective: Vital Signs: BP 123/80 (BP Location: Left Arm, Patient Position: Sitting, Cuff Size: Normal)   Pulse 83   Resp 15   Ht '5\' 6"'$  (1.676 m)   Wt 159 lb (72.1 kg)   LMP 06/01/2016   BMI 25.66 kg/m    Physical Exam Vitals and nursing note reviewed.  Constitutional:      Appearance: She is well-developed.  HENT:     Head: Normocephalic and atraumatic.  Eyes:     Conjunctiva/sclera: Conjunctivae normal.  Cardiovascular:     Rate and Rhythm: Normal rate and regular rhythm.     Heart sounds: Normal heart sounds.  Pulmonary:     Effort: Pulmonary effort is normal.     Breath sounds: Normal breath sounds.  Abdominal:     General: Bowel sounds are normal.     Palpations: Abdomen is soft.  Musculoskeletal:     Cervical back: Normal range of motion.  Lymphadenopathy:     Cervical: No cervical adenopathy.  Skin:    General: Skin is warm and  dry.     Capillary Refill: Capillary refill takes less than 2 seconds.  Neurological:     Mental Status: She is alert and oriented to person, place, and time.  Psychiatric:        Behavior: Behavior normal.     Musculoskeletal Exam: C-spine was in good range of motion.  Right shoulder  joint was in good range of motion.  Left shoulder joint had full range of motion on forward flexion and abduction but had limited internal rotation.  Elbow joints and wrist joints in good range of motion.  Bilateral PIP and DIP thickening was noted.  She had limited range of motion of her right fifth PIP and DIP joints.  No synovitis was noted.  There was no synovitis over MCPs or wrist joints.  Hip joints and knee joints with good range of motion.  She had no tenderness over ankles or MTPs.  CDAI Exam: CDAI Score: -- Patient Global: --; Provider Global: -- Swollen: --; Tender: -- Joint Exam 12/04/2021   No joint exam has been documented for this visit   There is currently no information documented on the homunculus. Go to the Rheumatology activity and complete the homunculus joint exam.  Investigation: No additional findings.  Imaging: XR Hand 2 View Left  Result Date: 12/04/2021 CMC, PIP and DIP narrowing was noted.  No MCP, intercarpal radiocarpal joint space narrowing was noted.  No erosive changes were noted.  No radiographic progression was noted when compared to the x-rays of 2021. Impression: These findings are consistent with osteoarthritis of the hand.  XR Hand 2 View Right  Result Date: 12/04/2021 CMC, PIP and DIP narrowing was noted.  No MCP, intercarpal radiocarpal joint space narrowing was noted.  No erosive changes were noted.  No radiographic progression was noted when compared to the x-rays of 2021. Impression: These findings are consistent with osteoarthritis of the hand.   Recent Labs: Lab Results  Component Value Date   WBC 3.7 (L) 09/21/2019   HGB 14.4 09/21/2019   PLT 230.0  09/21/2019   NA 137 09/21/2019   K 3.9 09/21/2019   CL 104 09/21/2019   CO2 28 09/21/2019   GLUCOSE 85 09/21/2019   BUN 7 09/21/2019   CREATININE 0.64 09/21/2019   BILITOT 0.5 09/21/2019   ALKPHOS 64 09/21/2019   AST 17 09/21/2019   ALT 10 09/21/2019   PROT 7.5 09/21/2019   ALBUMIN 4.5 09/21/2019   CALCIUM 9.1 09/21/2019   GFRAA >60 07/24/2017    Speciality Comments: No specialty comments available.  Procedures:  No procedures performed Allergies: Pamelor [nortriptyline hcl]   Assessment / Plan:     Visit Diagnoses: Primary osteoarthritis of both hands-she has been experiencing increased discomfort in her hands over the last year.  She states she is unable to make a fist anymore.  She has noticed swan-neck deformities in her right hand.  She had very limited range of motion in her right middle finger PIP and DIP joints.  PIP and DIP thickening was noted bilaterally.  X-rays of bilateral hands were obtained today and were compared to the previous x-rays of 2021.  There was no radiographic progression when compared to the x-rays of 2021.  She has hypermobility in her PIP and DIP joints.  Stretching exercises were demonstrated in the office.  A handout on hand exercises was given.  Detailed counsel regarding osteoarthritis was provided.  She also has developed some contractures in her fingers.  She would benefit from physical therapy.  I will refer her to hand rehab center.  She may benefit from wearing the splint to her right fifth ring finger.  She is also interested in seeing an orthopedic surgeon for possible joint replacement of the right fifth PIP joint.  I will refer her to Dr. Amedeo Plenty.  Adhesive capsulitis of both shoulders-she had it is  of capsulitis in the past.  She had limited internal rotation of her left shoulder joint.  Positive ANA (antinuclear antibody) - 06/27/19: ANA 1:40 cytoplasmic, CRP <1: We discussed the insignificance of the low titer ANA result.  All other  antibodies are negative.  She has no clinical features of autoimmune disease.  Sicca syndrome (HCC)-he continues to have dry mouth and dry eyes.  Fibromyalgia-she continues to have some generalized pain.  Primary insomnia  Vitamin D deficiency  Orders: Orders Placed This Encounter  Procedures   XR Hand 2 View Right   XR Hand 2 View Left   Ambulatory referral to Hand Surgery   Ambulatory referral to Hand Surgery   No orders of the defined types were placed in this encounter.   Follow-Up Instructions: Return if symptoms worsen or fail to improve, for Osteoarthritis.   Bo Merino, MD  Note - This record has been created using Editor, commissioning.  Chart creation errors have been sought, but may not always  have been located. Such creation errors do not reflect on  the standard of medical care.

## 2021-12-04 ENCOUNTER — Encounter: Payer: Self-pay | Admitting: Rheumatology

## 2021-12-04 ENCOUNTER — Ambulatory Visit: Payer: BC Managed Care – PPO | Admitting: Rheumatology

## 2021-12-04 ENCOUNTER — Ambulatory Visit (INDEPENDENT_AMBULATORY_CARE_PROVIDER_SITE_OTHER): Payer: BC Managed Care – PPO

## 2021-12-04 ENCOUNTER — Ambulatory Visit: Payer: Self-pay

## 2021-12-04 VITALS — BP 123/80 | HR 83 | Resp 15 | Ht 66.0 in | Wt 159.0 lb

## 2021-12-04 DIAGNOSIS — M3500 Sicca syndrome, unspecified: Secondary | ICD-10-CM | POA: Diagnosis not present

## 2021-12-04 DIAGNOSIS — M7501 Adhesive capsulitis of right shoulder: Secondary | ICD-10-CM | POA: Diagnosis not present

## 2021-12-04 DIAGNOSIS — F5101 Primary insomnia: Secondary | ICD-10-CM

## 2021-12-04 DIAGNOSIS — M7502 Adhesive capsulitis of left shoulder: Secondary | ICD-10-CM

## 2021-12-04 DIAGNOSIS — M19042 Primary osteoarthritis, left hand: Secondary | ICD-10-CM | POA: Diagnosis not present

## 2021-12-04 DIAGNOSIS — M19041 Primary osteoarthritis, right hand: Secondary | ICD-10-CM

## 2021-12-04 DIAGNOSIS — M720 Palmar fascial fibromatosis [Dupuytren]: Secondary | ICD-10-CM

## 2021-12-04 DIAGNOSIS — R5383 Other fatigue: Secondary | ICD-10-CM

## 2021-12-04 DIAGNOSIS — R768 Other specified abnormal immunological findings in serum: Secondary | ICD-10-CM

## 2021-12-04 DIAGNOSIS — E559 Vitamin D deficiency, unspecified: Secondary | ICD-10-CM

## 2021-12-04 DIAGNOSIS — M797 Fibromyalgia: Secondary | ICD-10-CM

## 2021-12-04 NOTE — Patient Instructions (Signed)
Hand Exercises Hand exercises can be helpful for almost anyone. These exercises can strengthen the hands, improve flexibility and movement, and increase blood flow to the hands. These results can make work and daily tasks easier. Hand exercises can be especially helpful for people who have joint pain from arthritis or have nerve damage from overuse (carpal tunnel syndrome). These exercises can also help people who have injured a hand. Exercises Most of these hand exercises are gentle stretching and motion exercises. It is usually safe to do them often throughout the day. Warming up your hands before exercise may help to reduce stiffness. You can do this with gentle massage or by placing your hands in warm water for 10-15 minutes. It is normal to feel some stretching, pulling, tightness, or mild discomfort as you begin new exercises. This will gradually improve. Stop an exercise right away if you feel sudden, severe pain or your pain gets worse. Ask your health care provider which exercises are best for you. Knuckle bend or "claw" fist  Stand or sit with your arm, hand, and all five fingers pointed straight up. Make sure to keep your wrist straight during the exercise. Gently bend your fingers down toward your palm until the tips of your fingers are touching the top of your palm. Keep your big knuckle straight and just bend the small knuckles in your fingers. Hold this position for __________ seconds. Straighten (extend) your fingers back to the starting position. Repeat this exercise 5-10 times with each hand. Full finger fist  Stand or sit with your arm, hand, and all five fingers pointed straight up. Make sure to keep your wrist straight during the exercise. Gently bend your fingers into your palm until the tips of your fingers are touching the middle of your palm. Hold this position for __________ seconds. Extend your fingers back to the starting position, stretching every joint fully. Repeat  this exercise 5-10 times with each hand. Straight fist Stand or sit with your arm, hand, and all five fingers pointed straight up. Make sure to keep your wrist straight during the exercise. Gently bend your fingers at the big knuckle, where your fingers meet your hand, and the middle knuckle. Keep the knuckle at the tips of your fingers straight and try to touch the bottom of your palm. Hold this position for __________ seconds. Extend your fingers back to the starting position, stretching every joint fully. Repeat this exercise 5-10 times with each hand. Tabletop  Stand or sit with your arm, hand, and all five fingers pointed straight up. Make sure to keep your wrist straight during the exercise. Gently bend your fingers at the big knuckle, where your fingers meet your hand, as far down as you can while keeping the small knuckles in your fingers straight. Think of forming a tabletop with your fingers. Hold this position for __________ seconds. Extend your fingers back to the starting position, stretching every joint fully. Repeat this exercise 5-10 times with each hand. Finger spread  Place your hand flat on a table with your palm facing down. Make sure your wrist stays straight as you do this exercise. Spread your fingers and thumb apart from each other as far as you can until you feel a gentle stretch. Hold this position for __________ seconds. Bring your fingers and thumb tight together again. Hold this position for __________ seconds. Repeat this exercise 5-10 times with each hand. Making circles  Stand or sit with your arm, hand, and all five fingers pointed   straight up. Make sure to keep your wrist straight during the exercise. Make a circle by touching the tip of your thumb to the tip of your index finger. Hold for __________ seconds. Then open your hand wide. Repeat this motion with your thumb and each finger on your hand. Repeat this exercise 5-10 times with each hand. Thumb  motion  Sit with your forearm resting on a table and your wrist straight. Your thumb should be facing up toward the ceiling. Keep your fingers relaxed as you move your thumb. Lift your thumb up as high as you can toward the ceiling. Hold for __________ seconds. Bend your thumb across your palm as far as you can, reaching the tip of your thumb for the small finger (pinkie) side of your palm. Hold for __________ seconds. Repeat this exercise 5-10 times with each hand. Grip strengthening  Hold a stress ball or other soft ball in the middle of your hand. Slowly increase the pressure, squeezing the ball as much as you can without causing pain. Think of bringing the tips of your fingers into the middle of your palm. All of your finger joints should bend when doing this exercise. Hold your squeeze for __________ seconds, then relax. Repeat this exercise 5-10 times with each hand. Contact a health care provider if: Your hand pain or discomfort gets much worse when you do an exercise. Your hand pain or discomfort does not improve within 2 hours after you exercise. If you have any of these problems, stop doing these exercises right away. Do not do them again unless your health care provider says that you can. Get help right away if: You develop sudden, severe hand pain or swelling. If this happens, stop doing these exercises right away. Do not do them again unless your health care provider says that you can. This information is not intended to replace advice given to you by your health care provider. Make sure you discuss any questions you have with your health care provider. Document Revised: 10/04/2020 Document Reviewed: 10/04/2020 Elsevier Patient Education  2023 Elsevier Inc.  

## 2022-08-05 ENCOUNTER — Ambulatory Visit: Payer: BC Managed Care – PPO | Admitting: Family Medicine

## 2022-08-05 ENCOUNTER — Encounter: Payer: Self-pay | Admitting: Family Medicine

## 2022-08-05 VITALS — BP 106/76 | HR 103 | Temp 97.8°F | Wt 171.6 lb

## 2022-08-05 DIAGNOSIS — J019 Acute sinusitis, unspecified: Secondary | ICD-10-CM

## 2022-08-05 MED ORDER — AMOXICILLIN-POT CLAVULANATE 875-125 MG PO TABS: 1.0000 | ORAL_TABLET | Freq: Two times a day (BID) | ORAL | 0 refills | Status: DC

## 2022-08-05 NOTE — Progress Notes (Signed)
   Subjective:    Patient ID: Brandi Cherry, female    DOB: 1965/10/01, 56 y.o.   MRN: [id number]  HPI Here for 2 weeks of stuffy head, pressure in both ears, PND, and ST. No fever or cough. Using Mucinex and saline nasal sprays. She went to [facility] a few days ago and she tested negative for influenza and Covid.    Review of Systems  Constitutional: Negative.   HENT:  Positive for congestion, ear pain, postnasal drip, sinus pressure and sore throat.   Eyes: Negative.   Respiratory: Negative.         Objective:   Physical Exam Constitutional:      Appearance: Normal appearance.  HENT:     Right Ear: Tympanic membrane, ear canal and external ear normal.     Left Ear: Tympanic membrane, ear canal and external ear normal.     Nose: Nose normal.     Mouth/Throat:     Pharynx: Oropharynx is clear.  Eyes:     Conjunctiva/sclera: Conjunctivae normal.  Pulmonary:     Effort: Pulmonary effort is normal.     Breath sounds: Normal breath sounds.  Lymphadenopathy:     Cervical: No cervical adenopathy.  Neurological:     Mental Status: She is alert.           Assessment & Plan:  Sinusitis, treat with 10 days of Augmentin.  Alysia Penna, MD

## 2022-09-17 ENCOUNTER — Ambulatory Visit: Payer: BC Managed Care – PPO | Admitting: Adult Health

## 2022-09-17 ENCOUNTER — Encounter: Payer: Self-pay | Admitting: Adult Health

## 2022-09-17 VITALS — BP 100/80 | HR 76 | Temp 97.9°F | Ht 66.0 in | Wt 166.0 lb

## 2022-09-17 DIAGNOSIS — R051 Acute cough: Secondary | ICD-10-CM | POA: Diagnosis not present

## 2022-09-17 DIAGNOSIS — M25552 Pain in left hip: Secondary | ICD-10-CM | POA: Diagnosis not present

## 2022-09-17 MED ORDER — PREDNISONE 10 MG PO TABS: ORAL_TABLET | ORAL | 0 refills | Status: DC

## 2022-09-17 NOTE — Progress Notes (Signed)
Subjective:    Patient ID: Brandi Cherry, female    DOB: July 05, 1965, 57 y.o.   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  She presents to the office today for an acute issue. She was seen by Dr. Sarajane Jews about six weeks ago for sinusitis. She prescribed Augmentin for suspected sinusitis. This cleared up her symptoms but then she developed a dry cough with a tickle in her upper chest. The cough is dry and worse in the morning and night. Cough has been present for a few weeks.  She denies fevers, chills, shortness of breath or wheezing.   She has not been using anything OTC   Additionally she also has been having left hip pain x 10 days. Pain is present with palpation and when changing positions. She denies trauma or injury. She does sit a lot at work. She has found a heating pad helps    Review of Systems See HPI   Past Medical History:  Diagnosis Date   Adhesive capsulitis of shoulder 2014   s/p GH steroid injection   AVNRT (AV nodal re-entry tachycardia) 04/2013   s/p ablation (Dr. Teena [facility]unk at [facility])   Benign fasciculations    over 2 years   Cancer [facility])    skin cancer, 3 basal cell/on forehead, top head, top arm   Hemorrhoids    has had since childbirth   History of miscarriage    G5P2   History of UTI    tends to get with relations   HSV-2 (herpes simplex virus 2) infection    on L lateral thigh only, dx by GYN with swab   Hx of migraines    with cycle   Melanoma in situ of face ([place])    gets yearly skin checks [name])   Osteoarthritis 2015   IPs of hands   Post-operative nausea and  vomiting    Urine incontinence    Vertigo     Social History   Socioeconomic History   Marital status: Married    Spouse name: Not on file   Number of children: 2   Years of education: masters   Highest education level: Not on file  Occupational History   Occupation: counselor    Employer: [employer]  Tobacco Use   Smoking status: Never   Smokeless tobacco: Never  Vaping Use   Vaping Use: Never used  Substance and Sexual Activity   Alcohol use: No    Alcohol/week: 0.0 standard drinks of alcohol   Drug use: No   Sexual activity: Not on file  Other Topics Concern   Not on file  Social History Narrative   Caffeine: occasional tea   Lives with husband, son and daughter   Occupation; Animal nutritionist at [employer]   Edu: Masters   Activity: none currently   Diet: good water, daily fruits/vegetables, red meat 1x/wk, fish 2x/wk   Social Determinants of Radio broadcast assistant Strain: Not on file  Food Insecurity: Not on file  Transportation Needs: Not on file  Physical Activity: Not on file  Stress: Not on file  Social Connections: Not on file  Intimate Partner Violence:  Not on file    Past Surgical History:  Procedure Laterality Date   BASAL CELL CARCINOMA EXCISION     forehead, left arm   CARDIAC ELECTROPHYSIOLOGY STUDY AND ABLATION  04/2013   AVNRT (EP Dr. Teena [facility]unk at [facility])   [place]  05/2013   uterine polyp with menorrhagia   hospitalization  2002   urosepsis, again with son    Family History  Problem Relation Age of Onset   Thyroid disease Mother 25   Neuropathy Mother    Coronary artery disease Father 57       MI   Hypertension Father    Cancer Maternal Uncle        unsure   Mental retardation Maternal Grandmother        bipolar   Arthritis Other        strong on mother's side, no RA though   Asthma Son    Colon cancer Maternal Grandfather    Diabetes Neg Hx    Rectal cancer Neg Hx      Allergies  Allergen Reactions   Gold Rash   Nickel Rash   Pamelor [Nortriptyline Hcl] Other (See Comments)    fasciculations    Current Outpatient Medications on File Prior to Visit  Medication Sig Dispense Refill   CALCIUM PO Take by mouth. Calcium and vit d chewable-Chew 2 daily     Cholecalciferol 1.25 MG (50000 UT) capsule 50,000 Units once a week.     COMBIPATCH 0.05-0.14 MG/DAY APPLY 1 PATCH TWICE A WEEK AS DIRECTED FOR 30 DAYS     Multiple Vitamin (MULTIVITAMIN) tablet Take 1 tablet by mouth daily.     valACYclovir (VALTREX) 500 MG tablet Take 500 mg by mouth daily as needed.     No current facility-administered medications on file prior to visit.    BP 100/80   Pulse 76   Temp 97.9 F (36.6 C) (Oral)   Ht 5\' 6"  (1.676 m)   Wt 166 lb (75.3 kg)   LMP 06/01/2016   SpO2 98%   BMI 26.79 kg/m       Objective:   Physical Exam Vitals and nursing note reviewed.  Constitutional:      Appearance: Normal appearance.  Cardiovascular:     Rate and Rhythm: Normal rate and regular rhythm.     Pulses: Normal pulses.     Heart sounds: Normal heart sounds.  Pulmonary:     Effort: Pulmonary effort is normal.     Breath sounds: Normal breath sounds.  Musculoskeletal:        General: Tenderness present. Normal range of motion.     Left hip: Tenderness present. No bony tenderness or crepitus. Normal range of motion. Normal strength.     Comments: Tenderness to the lateral aspect of left hip with palpation. No discomfort with ROM   Skin:    General: Skin is warm and dry.  Neurological:     General: No focal deficit present.     Mental Status: She is alert and oriented to person, place, and time.  Psychiatric:        Mood and Affect: Mood normal.        Behavior: Behavior normal.        Assessment & Plan:  1. Acute cough - No signs of bacterial infection  - predniSONE (DELTASONE) 10 MG tablet; 40 mg x 3 days, 20 mg x 3 days, 10 mg x 3 days  Dispense: 21 tablet;  Refill: 0  2. Left hip pain - Seems to be soft tissue such as muscle strain.  - Prednisone should help with this  - Continue with heating pad  - Follow up if pain does not resolve with prednisone therapy  Dorothyann Peng, NP  Time spent with patient today was 33 minutes which consisted of chart review, discussing  hip pain and cough, work up, treatment answering questions and documentation.

## 2023-04-21 ENCOUNTER — Encounter: Payer: Self-pay | Admitting: Family Medicine

## 2023-04-21 ENCOUNTER — Ambulatory Visit: Payer: BC Managed Care – PPO | Admitting: Family Medicine

## 2023-04-21 VITALS — BP 122/80 | HR 91 | Temp 97.8°F | Resp 12 | Ht 66.0 in | Wt 174.4 lb

## 2023-04-21 DIAGNOSIS — M3500 Sicca syndrome, unspecified: Secondary | ICD-10-CM | POA: Diagnosis not present

## 2023-04-21 DIAGNOSIS — J0100 Acute maxillary sinusitis, unspecified: Secondary | ICD-10-CM

## 2023-04-21 MED ORDER — AMOXICILLIN-POT CLAVULANATE 875-125 MG PO TABS: 1.0000 | ORAL_TABLET | Freq: Two times a day (BID) | ORAL | 0 refills | Status: AC

## 2023-04-21 MED ORDER — FLUTICASONE PROPIONATE 50 MCG/ACT NA SUSP: 1.0000 | Freq: Two times a day (BID) | NASAL | 0 refills | Status: DC

## 2023-04-21 NOTE — Progress Notes (Signed)
ACUTE VISIT Chief Complaint  Patient presents with   scratchy throat   facial pressure    Ongoing for 10 days, no mucous or cough. Negative covid test.    dry sinuses   HPI: BrandiBrandi Cherry is a 57 y.o. female with a PMHx significant for AVNRT, GERD, benign paroxysmal positional vertigo, and OA here today complaining of sore throat and facial pain as described above.  She complains of a scratchy throat for the last 10 days. She endorses associated headache and rhinorrhea for the first few days.   Sinus Problem The current episode started 1 to 4 weeks ago. The problem is unchanged. There has been no fever. The pain is moderate. Associated symptoms include headaches, sinus pressure and a sore throat. Pertinent negatives include no hoarse voice or shortness of breath.   She also mentions she feels pressure in her neck, ears, and sinuses.  Long history of dry eyes and mouth, sicca synd.  She denies fever, chills, postnasal drainage, cough, hearing changes, or hx of seasonal allergies.  She took Mucinex DM, but it hasn't helped.   AVNRT: She is not longer follows with cardiology.   Review of Systems  Constitutional:  Positive for fatigue. Negative for appetite change.  HENT:  Positive for sinus pressure and sore throat. Negative for hoarse voice.   Eyes:  Negative for discharge, redness and itching.  Respiratory:  Negative for shortness of breath and wheezing.   Cardiovascular:  Negative for chest pain.  Gastrointestinal:  Negative for abdominal pain, nausea and vomiting.  Skin:  Negative for rash.  Allergic/Immunologic: Negative for environmental allergies.  Neurological:  Positive for headaches. Negative for syncope.  Hematological:  Negative for adenopathy. Does not bruise/bleed easily.  See other pertinent positives and negatives in HPI.  Current Outpatient Medications on File Prior to Visit  Medication Sig Dispense Refill   CALCIUM PO Take by mouth. Calcium and vit d  chewable-Chew 2 daily     Cholecalciferol 1.25 MG (50000 UT) capsule 50,000 Units once a week.     COMBIPATCH 0.05-0.14 MG/DAY APPLY 1 PATCH TWICE A WEEK AS DIRECTED FOR 30 DAYS     Multiple Vitamin (MULTIVITAMIN) tablet Take 1 tablet by mouth daily.     valACYclovir (VALTREX) 500 MG tablet Take 500 mg by mouth daily as needed.     No current facility-administered medications on file prior to visit.   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 Constance [facility])   Benign fasciculations    over 2 years   Cancer [facility])    skin cancer, 3 basal cell/on forehead, top head, top arm   Hemorrhoids    has had since childbirth   History of miscarriage    G5P2   History of UTI    tends to get with relations   HSV-2 (herpes simplex virus 2) infection    on L lateral thigh only, dx by GYN with swab   Hx of migraines    with cycle   Melanoma in situ of face [facility])    gets yearly skin checks [name])   Osteoarthritis 2015   IPs of hands   Post-operative nausea and vomiting    Urine incontinence    Vertigo    Allergies  Allergen Reactions   Gold Rash   Nickel Rash   Pamelor [Nortriptyline  Hcl] Other (See Comments)    fasciculations   Social History   Socioeconomic History   Marital status: Married    Spouse name: Not on file   Number of children: 2   Years of education: masters   Highest education level: Not on file  Occupational History   Occupation: counselor    Employer: [employer]  Tobacco Use   Smoking status: Never   Smokeless tobacco: Never  Vaping Use   Vaping status: Never Used  Substance and Sexual Activity   Alcohol use: No    Alcohol/week: 0.0 standard drinks of alcohol   Drug use: No   Sexual activity: Not on file  Other Topics Concern   Not on file  Social History Narrative   Caffeine: occasional tea    Lives with husband, son and daughter   Occupation; Clinical biochemist at [employer]   Edu: Masters   Activity: none currently   Diet: good water, daily fruits/vegetables, red meat 1x/wk, fish 2x/wk   Social Determinants of Corporate investment banker Strain: Not on file  Food Insecurity: Not on file  Transportation Needs: Not on file  Physical Activity: Not on file  Stress: Not on file  Social Connections: Not on file   Vitals:   04/21/23 1559  BP: 122/80  Pulse: 91  Resp: 12  Temp: 97.8 F (36.6 C)  SpO2: 99%   Body mass index is 28.14 kg/m.  Physical Exam Vitals and nursing note reviewed.  Constitutional:      General: She is not in acute distress.    Appearance: She is well-developed. She is not ill-appearing.  HENT:     Head: Normocephalic and atraumatic.     Right Ear: External ear normal. Tympanic membrane is not erythematous.     Left Ear: Tympanic membrane, ear canal and external ear normal.     Ears:     Comments: Excess cerumen in right ear canal, can see TM partially.    Nose: Septal deviation present. No rhinorrhea.     Right Sinus: Maxillary sinus tenderness present. No frontal sinus tenderness.     Left Sinus: No maxillary sinus tenderness or frontal sinus tenderness.     Mouth/Throat:     Mouth: Mucous membranes are dry.     Pharynx: Oropharynx is clear.  Eyes:     Conjunctiva/sclera: Conjunctivae normal.  Cardiovascular:     Rate and Rhythm: Normal rate and regular rhythm.     Heart sounds: No murmur heard. Pulmonary:     Effort: Pulmonary effort is normal. No respiratory distress.     Breath sounds: Normal breath sounds. No stridor.  Musculoskeletal:     Cervical back: No muscular tenderness.  Lymphadenopathy:     Cervical: No cervical adenopathy.  Skin:    General: Skin is warm.     Findings: No erythema or rash.  Neurological:     Mental Status: She is alert and oriented to person, place, and time.  Psychiatric:        Mood  and Affect: Mood and affect normal.   ASSESSMENT AND PLAN:  Brandi Cherry was seen today for sore throat and pressure in her face and ears.   Acute non-recurrent maxillary sinusitis We discussed differential Dx.  Viral vs allergic. I recommend holding on abx treatment for now. Try Flonase nasal spray daily for 10-14 days, Mucinex D x 10-14 d and nasal saline irrigations as needed. If problem is not any better in 4-5  days she can start abx treatment. If problem does not resolve, may need imaging.  -     Amoxicillin-Pot Clavulanate; Take 1 tablet by mouth 2 (two) times daily for 7 days.  Dispense: 14 tablet; Refill: 0 -     Fluticasone Propionate; Place 1 spray into both nostrils 2 (two) times daily.  Dispense: 16 g; Refill: 0  Sicca syndrome (HCC) Could be aggravating above problem. Recommend nasal saline irrigations. Avoid OTC antihistaminics.  Return if symptoms worsen or fail to improve.  I, Rolla Etienne Wierda, acting as a scribe for Lorel Lembo [place], MD., have documented all relevant documentation on the behalf of Edris Schneck [place], MD, as directed by  Kaiyden Simkin [place], MD while in the presence of Yoshiharu Brassell [place], MD.   I, Wilber Fini [place], MD, have reviewed all documentation for this visit. The documentation on 04/23/23 for the exam, diagnosis, procedures, and orders are all accurate and complete.  Yamin Swingler G. [place], MD  [facility]. [place] office.

## 2023-04-21 NOTE — Patient Instructions (Addendum)
A few things to remember from today's visit:  Acute non-recurrent maxillary sinusitis - Plan: amoxicillin-clavulanate (AUGMENTIN) 875-125 MG tablet  Sicca syndrome (HCC) Could be sinus congestion. Try Mucinex D for 10-14 days once daily and nasal saline irrigations as needed. Flonase nasal spray at night for 10-14 days may also help.  If not improve in 4-5 days you can take antibiotic.  Do not use My Chart to request refills or for acute issues that need immediate attention. If you send a my chart message, it may take a few days to be addressed, specially if I am not in the office.  Please be sure medication list is accurate. If a new problem present, please set up appointment sooner than planned today.

## 2023-05-18 ENCOUNTER — Other Ambulatory Visit: Payer: Self-pay | Admitting: Family Medicine

## 2023-05-18 DIAGNOSIS — J0100 Acute maxillary sinusitis, unspecified: Secondary | ICD-10-CM

## 2023-09-30 ENCOUNTER — Encounter: Payer: Self-pay | Admitting: Family Medicine

## 2023-09-30 ENCOUNTER — Ambulatory Visit: Admitting: Family Medicine

## 2023-09-30 ENCOUNTER — Other Ambulatory Visit: Payer: Self-pay | Admitting: Family Medicine

## 2023-09-30 VITALS — BP 112/78 | HR 91 | Temp 97.8°F | Resp 12 | Ht 66.0 in

## 2023-09-30 DIAGNOSIS — R051 Acute cough: Secondary | ICD-10-CM

## 2023-09-30 DIAGNOSIS — J069 Acute upper respiratory infection, unspecified: Secondary | ICD-10-CM

## 2023-09-30 LAB — POC COVID19 BINAXNOW: SARS Coronavirus 2 Ag: NEGATIVE

## 2023-09-30 MED ORDER — FLUTICASONE PROPIONATE 50 MCG/ACT NA SUSP: 2.0000 | Freq: Every day | NASAL | 0 refills | Status: DC

## 2023-09-30 MED ORDER — BENZONATATE 100 MG PO CAPS: 100.0000 mg | ORAL_CAPSULE | Freq: Two times a day (BID) | ORAL | 0 refills | Status: AC | PRN

## 2023-09-30 NOTE — Progress Notes (Signed)
 ACUTE VISIT Chief Complaint  Patient presents with   neck pressure   Sore Throat    Started Friday   HPI: [name] is a 58 y.o. female with a PMHx significant for GERD, benign paroxysmal positional vertigo, and OA, who is here today complaining of URI symptoms.   Patient complains of sore throat, sinus pressure, rhinorrhea and bilateral ear ache since 3/28. This morning, she has gotten significantly worse and started with dry cough and body aches.   She has been taking Dayquil and Nyquil. She tested negative for Covid at home.   Pertinent negatives include fever, chills, nasal congestion, chest pain, SOB, wheezing, nausea, or changes in bowel habits. 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  Review of Systems  Constitutional:  Positive for activity change and fatigue. Negative for appetite change.  HENT:  Negative for dental problem, ear discharge, facial swelling and hearing loss.   Eyes:  Negative for discharge and redness.  Respiratory:  Negative for stridor.   Gastrointestinal:  Negative for abdominal pain and vomiting.  Genitourinary:  Negative for decreased urine volume, dysuria and hematuria.  Musculoskeletal:  Negative for gait problem and joint swelling.  Skin:  Negative for rash.  Allergic/Immunologic: Positive for environmental allergies.  Neurological:  Negative for syncope, facial asymmetry and weakness.  See other pertinent positives and negatives in HPI.  Current Outpatient Medications on File Prior to Visit  Medication Sig Dispense Refill   CALCIUM PO Take by mouth. Calcium and vit d chewable-Chew 2 daily     Cholecalciferol 1.25 MG (50000 UT) capsule 50,000 Units once a week.     COMBIPATCH 0.05-0.14 MG/DAY APPLY 1 PATCH TWICE A WEEK AS DIRECTED FOR 30 DAYS     Multiple Vitamin (MULTIVITAMIN) tablet Take 1 tablet by mouth daily.     valACYclovir (VALTREX) 500 MG tablet Take 500 mg by mouth daily as needed.      No current facility-administered medications on file prior to visit.    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 Constance Haw at [facility])   Benign fasciculations    over 2 years   Cancer [facility])    skin cancer, 3 basal cell/on forehead, top head, top arm   Hemorrhoids    has had since childbirth   History of miscarriage    G5P2   History of UTI    tends to get with relations   HSV-2 (herpes simplex virus 2) infection    on L lateral thigh only, dx by GYN with swab   Hx of migraines    with cycle   Melanoma in situ of face (HCC)    gets yearly skin checks [name])   Osteoarthritis 2015   IPs of hands   Post-operative nausea and vomiting    Urine incontinence    Vertigo    Allergies  Allergen Reactions   Gold Rash   Nickel Rash   Pamelor [Nortriptyline Hcl] Other (See Comments)    fasciculations    Social History   Socioeconomic History   Marital status: Married    Spouse name: Not on file   Number of children: 2   Years of education: masters   Highest education level: Not on file  Occupational History   Occupation: Glass blower/designer: [employer]  Tobacco Use   Smoking status: Never   Smokeless tobacco: Never  Vaping Use   Vaping status: Never Used  Substance and Sexual Activity   Alcohol use: No    Alcohol/week: 0.0 standard drinks of alcohol   Drug use: No   Sexual activity: Not on file  Other Topics Concern   Not on file  Social History Narrative   Caffeine: occasional tea   Lives with husband, son and daughter   Occupation; Clinical biochemist at [employer] [place]   Edu: Masters   Activity: none currently   Diet: good water, daily fruits/vegetables, red meat 1x/wk, fish 2x/wk   Social Drivers of Corporate investment banker Strain: Not on file  Food Insecurity: Not on file  Transportation  Needs: Not on file  Physical Activity: Not on file  Stress: Not on file  Social Connections: Not on file    Vitals:   09/30/23 1036  BP: 112/78  Pulse: 91  Resp: 12  Temp: 97.8 F (36.6 C)  SpO2: 97%   Body mass index is 28.14 kg/m.  Physical Exam Vitals and nursing note reviewed.  Constitutional:      General: She is not in acute distress.    Appearance: She is well-developed. She is not ill-appearing.  HENT:     Head: Atraumatic.     Right Ear: External ear normal. Tympanic membrane is not erythematous.     Left Ear: External ear normal. A middle ear effusion (mild) is present. Tympanic membrane is not erythematous.     Ears:     Comments: Right ear canal cerumen, can see TM partially.    Nose: Rhinorrhea present.     Right Sinus: No maxillary sinus tenderness or frontal sinus tenderness.     Left Sinus: No maxillary sinus tenderness or frontal sinus tenderness.     Comments: Hypertrophic turbinates    Mouth/Throat:     Mouth: Mucous membranes are moist.     Pharynx: Oropharynx is clear. Uvula midline. Posterior oropharyngeal erythema present. No postnasal drip.  Eyes:     Conjunctiva/sclera: Conjunctivae normal.  Cardiovascular:     Rate and Rhythm: Normal rate and regular rhythm.     Heart sounds: No murmur heard. Pulmonary:     Effort: Pulmonary effort is normal. No respiratory distress.     Breath sounds: Normal breath sounds. No stridor.  Lymphadenopathy:     Head:     Right side of head: No submandibular adenopathy.     Left side of head: No submandibular adenopathy.     Cervical: No cervical adenopathy.  Skin:    General: Skin is warm.     Findings: No erythema or rash.  Neurological:     Mental Status: She is alert and oriented to person, place, and time.  Psychiatric:        Mood and Affect: Mood and affect normal.   ASSESSMENT AND PLAN:  Ms. Schurman was seen today for cough.   URI, acute We discussed differential diagnosis, history suggest a  viral illness, seasonal allergies could also aggravate problem. I do not think abx is needed. COVID 19 test repeated in the office and negative. Recommend symptomatic treatment. Continue DayQuil and NyQuil. Flonase nasal spray at bedtime for 10 to 14 days and nasal saline irrigations as needed for nasal congestion. Auto inflation maneuvers throughout the day to help with possible left eustachian tube dysfunction. Monitor for new onset fever.  -     POC  COVID-19 BinaxNow  Acute cough Explained that cough and congestion may last a few more days and even weeks after acute symptoms resolved. Lung auscultation normal, I do not think imaging is needed. Cough may become more productive, in which case she can start Mucinex. For now benzonatate recommended for symptomatic treatment.  -     Benzonatate; Take 1 capsule (100 mg total) by mouth 2 (two) times daily as needed for up to 10 days.  Dispense: 20 capsule; Refill: 0  Return if symptoms worsen or fail to improve.  I, Rolla Etienne Wierda, acting as a scribe for Julieta Rogalski [place], MD., have documented all relevant documentation on the behalf of Brandi Kesselman [place], MD, as directed by  Shela Esses [place], MD while in the presence of Aundray Cartlidge [place], MD.   I, Jorma Tassinari [place], MD, have reviewed all documentation for this visit. The documentation on 09/30/23 for the exam, diagnosis, procedures, and orders are all accurate and complete.  Kiylee Thoreson G. [place], MD  [facility] [place] office.

## 2023-09-30 NOTE — Patient Instructions (Addendum)
 A few things to remember from today's visit:  URI, acute - Plan: POC COVID-19  It seems like a viral illness. Monitor for fever.  viral infections are self-limited and we treat each symptom depending of severity.  Over the counter medications as decongestants and cold medications usually help, they need to be taken with caution if there is a history of high blood pressure or palpitations. Tylenol and/or Ibuprofen also helps with most symptoms (headache, muscle aching, fever,etc) Plenty of fluids. Honey helps with cough. Steam inhalations helps with runny nose, nasal congestion, and may prevent sinus infections. Cough and nasal congestion could last a few days and sometimes weeks. Please follow in not any better in 1-2 weeks or if symptoms get worse.  If you need refills for medications you take chronically, please call your pharmacy. Do not use My Chart to request refills or for acute issues that need immediate attention. If you send a my chart message, it may take a few days to be addressed, specially if I am not in the office.  Please be sure medication list is accurate. If a new problem present, please set up appointment sooner than planned today.

## 2024-01-12 ENCOUNTER — Ambulatory Visit: Admitting: Family Medicine

## 2024-01-12 ENCOUNTER — Ambulatory Visit: Payer: Self-pay | Admitting: *Deleted

## 2024-01-12 ENCOUNTER — Encounter: Payer: Self-pay | Admitting: Family Medicine

## 2024-01-12 VITALS — BP 128/80 | HR 94 | Temp 98.9°F | Resp 16 | Ht 66.0 in | Wt 174.0 lb

## 2024-01-12 DIAGNOSIS — R002 Palpitations: Secondary | ICD-10-CM

## 2024-01-12 DIAGNOSIS — J029 Acute pharyngitis, unspecified: Secondary | ICD-10-CM | POA: Diagnosis not present

## 2024-01-12 DIAGNOSIS — R030 Elevated blood-pressure reading, without diagnosis of hypertension: Secondary | ICD-10-CM | POA: Diagnosis not present

## 2024-01-12 DIAGNOSIS — U071 COVID-19: Secondary | ICD-10-CM | POA: Diagnosis not present

## 2024-01-12 LAB — POC COVID19 BINAXNOW: SARS Coronavirus 2 Ag: POSITIVE — AB

## 2024-01-12 MED ORDER — NIRMATRELVIR/RITONAVIR (PAXLOVID)TABLET: 3.0000 | ORAL_TABLET | Freq: Two times a day (BID) | ORAL | 0 refills | Status: AC

## 2024-01-12 MED ORDER — BENZONATATE 100 MG PO CAPS: 100.0000 mg | ORAL_CAPSULE | Freq: Two times a day (BID) | ORAL | 0 refills | Status: AC | PRN

## 2024-01-12 NOTE — Patient Instructions (Addendum)
 A few things to remember from today's visit:  Sore throat - Plan: POC COVID-19  COVID-19 virus infection - Plan: nirmatrelvir /ritonavir  (PAXLOVID ) 20 x 150 MG & 10 x 100MG  TABS, benzonatate  (TESSALON ) 100 MG capsule  Elevated blood pressure reading  Palpitations  Lab appt when symptom resolved. Blood pressures in 2-3 weeks.  If you need refills for medications you take chronically, please call your pharmacy. Do not use My Chart to request refills or for acute issues that need immediate attention. If you send a my chart message, it may take a few days to be addressed, specially if I am not in the office.  Please be sure medication list is accurate. If a new problem present, please set up appointment sooner than planned today.

## 2024-01-12 NOTE — Progress Notes (Signed)
 ACUTE VISIT Chief Complaint  Patient presents with   Cough   Sore Throat    Started Sunday, returned from 9 day cruise    Ear Pain   Discussed the use of AI scribe software for clinical note transcription with the patient, who gave verbal consent to proceed.  History of Present Illness Jenisis Harmsen is a 58 year old female with past medical history significant for vertigo, GERD, OA, and sicca syndrome who presents with cough and earache after returning from a cruise.  She has been experiencing symptoms for three days following a nine-day cruise, including a non-productive cough, earache, and a sensation of sand in her throat. She describes severe chest wall pain with coughing.  Initially, she had a sore throat that made swallowing difficult, but the soreness has since subsided, leaving a grainy feeling when swallowing.   No nasal congestion, runny nose, headache, body aches, nausea, vomiting, fever, or chills.  Negative for headache, dyspnea, wheezing, or skin rash.  She has taken DayQuil without relief and reports poor sleep, having slept on the couch to avoid potentially infecting her husband. She tested negative for COVID-19 on Sunday.  No other family members are currently sick.  -She mentions that she has a history of heart procedure in 2014 for tachycardia, cardiac ablation. She occasionally experiences skipped heartbeats, which she felt during a [place] incident at the airport,  a few days ago. During this episode, she felt chest discomfort, no CP, and an irregular heartbeat, but her oxygen levels and heart rate were normal.  EMS was called, arrived an hour later when symptoms have resolved.  Her blood pressure was elevated at 157/110, which is unusual for her as she typically has normal readings. She attributes the incident to stress due to son's health concern and possibly missing her hormone patch for two days.  Her son recently experienced a medical crisis involving  hypothermia, low blood pressure, and acidosis, which required hospitalization. This event has caused significant stress for her, especially with her son preparing to leave for college.  Review of Systems  Constitutional:  Positive for activity change, appetite change and fatigue.  Eyes:  Negative for discharge and redness.  Respiratory:  Negative for shortness of breath.   Cardiovascular:  Negative for chest pain and leg swelling.  Gastrointestinal:  Negative for abdominal pain, nausea and vomiting.  Endocrine: Negative for cold intolerance and heat intolerance.  Genitourinary:  Negative for decreased urine volume, dysuria and hematuria.  Musculoskeletal:  Positive for myalgias. Negative for gait problem.  Skin:  Negative for rash.  Neurological:  Negative for syncope, facial asymmetry and weakness.  Psychiatric/Behavioral:  Negative for confusion.   See other pertinent positives and negatives in HPI.  Current Outpatient Medications on File Prior to Visit  Medication Sig Dispense Refill   CALCIUM PO Take by mouth. Calcium and vit d chewable-Chew 2 daily     Cholecalciferol 1.25 MG (50000 UT) capsule 50,000 Units once a week.     COMBIPATCH 0.05-0.14 MG/DAY APPLY 1 PATCH TWICE A WEEK AS DIRECTED FOR 30 DAYS     fluticasone  (FLONASE ) 50 MCG/ACT nasal spray SHAKE LIQUID AND USE 2 SPRAYS IN EACH NOSTRIL AT BEDTIME 48 g 0   Multiple Vitamin (MULTIVITAMIN) tablet Take 1 tablet by mouth daily.     valACYclovir (VALTREX) 500 MG tablet Take 500 mg by mouth daily as needed.     No current facility-administered medications on file prior to visit.    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 Clem Repress at [facility])   Benign fasciculations    over 2 years   Cancer [facility])    skin cancer, 3 basal cell/on forehead, top head, top arm   Hemorrhoids    has had since  childbirth   History of miscarriage    G5P2   History of UTI    tends to get with relations   HSV-2 (herpes simplex virus 2) infection    on L lateral thigh only, dx by GYN with swab   Hx of migraines    with cycle   Melanoma in situ of face (HCC)    gets yearly skin checks [facility]ie)   Osteoarthritis 2015   IPs of hands   Post-operative nausea and vomiting    Urine incontinence    Vertigo    Allergies  Allergen Reactions   Gold Rash   Nickel Rash   Pamelor  [Nortriptyline  Hcl] Other (See Comments)    fasciculations    Social History   Socioeconomic History   Marital status: Married    Spouse name: Not on file   Number of children: 2   Years of education: masters   Highest education level: Not on file  Occupational History   Occupation: Glass blower/designer: [employer]  Tobacco Use   Smoking status: Never   Smokeless tobacco: Never  Vaping Use   Vaping status: Never Used  Substance and Sexual Activity   Alcohol use: No    Alcohol/week: 0.0 standard drinks of alcohol   Drug use: No   Sexual activity: Not on file  Other Topics Concern   Not on file  Social History Narrative   Caffeine: occasional tea   Lives with husband, son and daughter   Occupation; 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  Physical Exam Vitals and nursing note reviewed.  Constitutional:      General: She is not in acute distress.    Appearance: She is well-developed. She is not ill-appearing.  HENT:      Head: Normocephalic and atraumatic.     Right Ear: External ear normal. Tympanic membrane is not erythematous.     Left Ear: Tympanic membrane, ear canal and external ear normal.     Ears:     Comments: Cerumen excess, tip of TM seen.    Nose: No rhinorrhea.     Mouth/Throat:     Pharynx: Posterior oropharyngeal erythema present.  Eyes:     Conjunctiva/sclera: Conjunctivae normal.  Cardiovascular:     Rate and Rhythm: Normal rate and regular rhythm.     Heart sounds: No murmur heard. Pulmonary:     Effort: Pulmonary effort is normal. No respiratory distress.     Breath sounds: Normal breath sounds. No stridor.  Lymphadenopathy:     Head:     Right side of head: No submandibular adenopathy.     Left side of head: No submandibular adenopathy.     Cervical: No cervical adenopathy.  Skin:    General: Skin is warm.     Findings: No erythema or rash.  Neurological:     General: No focal deficit present.     Mental Status: She is alert and oriented to person, place, and time.     Gait: Gait normal.  Psychiatric:        Mood and Affect: Mood and affect normal.    ASSESSMENT AND PLAN:  Ms Bow was seen for respiratory symptoms and episode of elevated BP.  Sore throat COVID-19 test done in the office positive.  -     POC COVID-19 BinaxNow  COVID-19 virus infection We discussed Dx,possible complications and treatment options. She has a mild to moderate case with low risk for complications. She agrees with starting Paxlovid . Symptomatic treatment with plenty of fluids,rest,tylenol 500 mg 3-4 times per day prn. Throat lozenges if needed for sore throat. Explained that cough and congestion may last a few more days and even weeks after acute symptoms have resolved. She has not noted fever, recommend isolation for 5 days from symptoms onset. Clearly instructed about warning signs.  -     nirmatrelvir /ritonavir ; Take 3 tablets by mouth 2 (two) times daily for 5 days. (Take  nirmatrelvir  150 mg two tablets twice daily for 5 days and ritonavir  100 mg one tablet twice daily for 5 days) Patient GFR is 97 in 2021.  Dispense: 30 tablet; Refill: 0 -     Benzonatate ; Take 1 capsule (100 mg total) by mouth 2 (two) times daily as needed for up to 10 days.  Dispense: 20 capsule; Refill: 0  Elevated blood pressure reading Reporting episode of elevated BP, which could have be caused by acute stress, 157/110; no history of hypertension. Recommend monitoring BP at home and to let me know about BP readings in 2 to 3 weeks.  -     Basic metabolic panel with GFR; Future -     CBC; Future  Palpitations Prior history of AV nodal reentry tachycardia, status post cardiac ablation. Last visit with cardiologist in 04/2018. She reports that she has intermittent episodes of palpitation, mild. Currently she is asymptomatic and cardiac auscultation normal, so we did not obtain EKG today. At this time she is dealing with an acute problem, COVID-19 infection, so when she recovers she will call for lab appointment. Instructed about warning signs.  -     Basic metabolic panel with GFR; Future -     CBC; Future -     TSH; Future  Return if symptoms worsen or fail to improve, for keep next appointment.  Shanna Strength G. [place], MD  [facility]. [place] [facility].

## 2024-01-12 NOTE — Telephone Encounter (Signed)
 FYI Only or Action Required?: Action required by provider: request for appointment.  Patient was last seen in primary care on 09/30/2023 by [place], Betty G, MD.  Called Nurse Triage reporting Cough.  Symptoms began several days ago.  Interventions attempted: Nothing.  Symptoms are: gradually worsening.  Triage Disposition: See Physician Within 24 Hours  Patient/caregiver understands and will follow disposition?: Yes            Copied from CRM [id number]. Topic: Clinical - Red Word Triage >> Jan 12, 2024  8:18 AM Larissa RAMAN wrote: [employer] that prompted transfer to Nurse Triage: pain, cough, sore throat Reason for Disposition  Earache is present  Answer Assessment - Initial Assessment Questions Appt scheduled today , chest pain from coughing. Nothing coming up yet but feels in chest. Ear pain reported. Recent vacation on a cruise.      1. ONSET: When did the cough begin?      3 days ago  2. SEVERITY: How bad is the cough today?      Getting worse 3. SPUTUM: Describe the color of your sputum (e.g., none, dry cough; clear, white, yellow, green)     Dry at this time 4. HEMOPTYSIS: Are you coughing up any blood? If Yes, ask: How much? (e.g., flecks, streaks, tablespoons, etc.)     na 5. DIFFICULTY BREATHING: Are you having difficulty breathing? If Yes, ask: How bad is it? (e.g., mild, moderate, severe)      No  6. FEVER: Do you have a fever? If Yes, ask: What is your temperature, how was it measured, and when did it start?     na 7. CARDIAC HISTORY: Do you have any history of heart disease? (e.g., heart attack, congestive heart failure)      na 8. LUNG HISTORY: Do you have any history of lung disease?  (e.g., pulmonary embolus, asthma, emphysema)     nan 9. PE RISK FACTORS: Do you have a history of blood clots? (or: recent major surgery, recent prolonged travel, bedridden)     na 10. OTHER SYMPTOMS: Do you have any other symptoms? (e.g., runny  nose, wheezing, chest pain)       Cough pain in chest with coughing, sore throat, pain in ears  11. PREGNANCY: Is there any chance you are pregnant? When was your last menstrual period?       na 12. TRAVEL: Have you traveled out of the country in the last month? (e.g., travel history, exposures)      Not out of country but on a cruise  Protocols used: Cough - Acute Non-Productive-A-AH
